# Patient Record
Sex: Female | Born: 1975 | Race: White | Hispanic: No | Marital: Married | Smoking: Never smoker
Health system: Southern US, Community
[De-identification: ages and names within clinical notes are randomized; demographics above are authoritative.]

## PROBLEM LIST (undated history)

## (undated) DIAGNOSIS — C801 Malignant (primary) neoplasm, unspecified: Secondary | ICD-10-CM

## (undated) DIAGNOSIS — E785 Hyperlipidemia, unspecified: Secondary | ICD-10-CM

## (undated) HISTORY — DX: Hyperlipidemia, unspecified: E78.5

---

## 2012-11-03 ENCOUNTER — Other Ambulatory Visit: Payer: Self-pay | Admitting: Obstetrics and Gynecology

## 2012-11-03 DIAGNOSIS — N6320 Unspecified lump in the left breast, unspecified quadrant: Secondary | ICD-10-CM

## 2012-11-14 ENCOUNTER — Ambulatory Visit
Admission: RE | Admit: 2012-11-14 | Discharge: 2012-11-14 | Disposition: A | Discharge status: Home / Self Care | Payer: Managed Care, Other (non HMO) | Source: Ambulatory Visit | Attending: Obstetrics and Gynecology | Admitting: Obstetrics and Gynecology

## 2012-11-14 DIAGNOSIS — N6320 Unspecified lump in the left breast, unspecified quadrant: Secondary | ICD-10-CM

## 2013-06-02 ENCOUNTER — Other Ambulatory Visit: Payer: Self-pay | Admitting: Obstetrics and Gynecology

## 2013-06-02 DIAGNOSIS — N6320 Unspecified lump in the left breast, unspecified quadrant: Secondary | ICD-10-CM

## 2013-06-08 ENCOUNTER — Other Ambulatory Visit: Payer: Managed Care, Other (non HMO)

## 2013-06-10 ENCOUNTER — Inpatient Hospital Stay: Admission: RE | Admit: 2013-06-10 | Payer: Managed Care, Other (non HMO) | Source: Ambulatory Visit

## 2013-08-04 ENCOUNTER — Ambulatory Visit
Admission: RE | Admit: 2013-08-04 | Discharge: 2013-08-04 | Disposition: A | Discharge status: Home / Self Care | Payer: Managed Care, Other (non HMO) | Source: Ambulatory Visit | Attending: Obstetrics and Gynecology | Admitting: Obstetrics and Gynecology

## 2013-08-04 DIAGNOSIS — N6320 Unspecified lump in the left breast, unspecified quadrant: Secondary | ICD-10-CM

## 2013-12-31 ENCOUNTER — Other Ambulatory Visit: Payer: Self-pay | Admitting: Physician Assistant

## 2013-12-31 ENCOUNTER — Other Ambulatory Visit: Payer: Self-pay | Admitting: Obstetrics and Gynecology

## 2013-12-31 DIAGNOSIS — N6320 Unspecified lump in the left breast, unspecified quadrant: Secondary | ICD-10-CM

## 2014-02-04 ENCOUNTER — Other Ambulatory Visit: Payer: Managed Care, Other (non HMO)

## 2014-02-15 ENCOUNTER — Ambulatory Visit
Admission: RE | Admit: 2014-02-15 | Discharge: 2014-02-15 | Disposition: A | Discharge status: Home / Self Care | Payer: Managed Care, Other (non HMO) | Source: Ambulatory Visit | Attending: Physician Assistant | Admitting: Physician Assistant

## 2014-02-15 DIAGNOSIS — N6320 Unspecified lump in the left breast, unspecified quadrant: Secondary | ICD-10-CM

## 2015-01-06 ENCOUNTER — Other Ambulatory Visit: Payer: Self-pay | Admitting: Physician Assistant

## 2015-01-06 DIAGNOSIS — N63 Unspecified lump in unspecified breast: Secondary | ICD-10-CM

## 2015-01-12 ENCOUNTER — Other Ambulatory Visit: Payer: Managed Care, Other (non HMO)

## 2015-03-31 ENCOUNTER — Ambulatory Visit
Admission: RE | Admit: 2015-03-31 | Discharge: 2015-03-31 | Disposition: A | Discharge status: Home / Self Care | Payer: Managed Care, Other (non HMO) | Source: Ambulatory Visit | Attending: Physician Assistant | Admitting: Physician Assistant

## 2015-03-31 DIAGNOSIS — N63 Unspecified lump in unspecified breast: Secondary | ICD-10-CM

## 2017-02-06 ENCOUNTER — Other Ambulatory Visit: Payer: Self-pay | Admitting: Physician Assistant

## 2017-02-06 DIAGNOSIS — Z1231 Encounter for screening mammogram for malignant neoplasm of breast: Secondary | ICD-10-CM

## 2017-02-21 ENCOUNTER — Ambulatory Visit
Admission: RE | Admit: 2017-02-21 | Discharge: 2017-02-21 | Disposition: A | Discharge status: Home / Self Care | Payer: BLUE CROSS/BLUE SHIELD | Source: Ambulatory Visit | Attending: Physician Assistant | Admitting: Physician Assistant

## 2017-02-21 DIAGNOSIS — Z1231 Encounter for screening mammogram for malignant neoplasm of breast: Secondary | ICD-10-CM

## 2018-02-12 ENCOUNTER — Other Ambulatory Visit: Payer: Self-pay | Admitting: Physician Assistant

## 2018-02-12 DIAGNOSIS — Z1231 Encounter for screening mammogram for malignant neoplasm of breast: Secondary | ICD-10-CM

## 2018-03-20 ENCOUNTER — Ambulatory Visit
Admission: RE | Admit: 2018-03-20 | Discharge: 2018-03-20 | Disposition: A | Discharge status: Home / Self Care | Payer: BLUE CROSS/BLUE SHIELD | Source: Ambulatory Visit | Attending: Physician Assistant | Admitting: Physician Assistant

## 2018-03-20 DIAGNOSIS — Z1231 Encounter for screening mammogram for malignant neoplasm of breast: Secondary | ICD-10-CM

## 2019-12-09 ENCOUNTER — Other Ambulatory Visit: Payer: Self-pay | Admitting: Physician Assistant

## 2019-12-09 DIAGNOSIS — Z1231 Encounter for screening mammogram for malignant neoplasm of breast: Secondary | ICD-10-CM

## 2019-12-16 ENCOUNTER — Other Ambulatory Visit: Payer: Self-pay

## 2019-12-16 ENCOUNTER — Ambulatory Visit
Admission: RE | Admit: 2019-12-16 | Discharge: 2019-12-16 | Disposition: A | Discharge status: Home / Self Care | Payer: BLUE CROSS/BLUE SHIELD | Source: Ambulatory Visit | Attending: Physician Assistant | Admitting: Physician Assistant

## 2019-12-16 DIAGNOSIS — Z1231 Encounter for screening mammogram for malignant neoplasm of breast: Secondary | ICD-10-CM

## 2021-03-02 ENCOUNTER — Other Ambulatory Visit: Payer: Self-pay | Admitting: Physician Assistant

## 2021-03-02 DIAGNOSIS — Z1231 Encounter for screening mammogram for malignant neoplasm of breast: Secondary | ICD-10-CM

## 2021-04-05 ENCOUNTER — Other Ambulatory Visit: Payer: Self-pay

## 2021-04-05 ENCOUNTER — Ambulatory Visit
Admission: RE | Admit: 2021-04-05 | Discharge: 2021-04-05 | Disposition: A | Discharge status: Home / Self Care | Payer: BC Managed Care – PPO | Source: Ambulatory Visit | Attending: Physician Assistant | Admitting: Physician Assistant

## 2021-04-05 DIAGNOSIS — Z1231 Encounter for screening mammogram for malignant neoplasm of breast: Secondary | ICD-10-CM

## 2022-02-27 ENCOUNTER — Other Ambulatory Visit: Payer: Self-pay | Admitting: Physician Assistant

## 2022-02-27 DIAGNOSIS — Z1231 Encounter for screening mammogram for malignant neoplasm of breast: Secondary | ICD-10-CM

## 2022-04-06 ENCOUNTER — Ambulatory Visit
Admission: RE | Admit: 2022-04-06 | Discharge: 2022-04-06 | Disposition: A | Discharge status: Home / Self Care | Payer: BC Managed Care – PPO | Source: Ambulatory Visit | Attending: Physician Assistant | Admitting: Physician Assistant

## 2022-04-06 DIAGNOSIS — Z1231 Encounter for screening mammogram for malignant neoplasm of breast: Secondary | ICD-10-CM

## 2023-03-04 IMAGING — MG MM DIGITAL SCREENING BILAT W/ TOMO AND CAD
8 series · 8 of 24 positions shown · non-contrast
Comparison: Previous exam(s).

CLINICAL DATA: Screening.

EXAM:
DIGITAL SCREENING BILATERAL MAMMOGRAM WITH TOMOSYNTHESIS AND CAD
TECHNIQUE: Bilateral screening digital craniocaudal and mediolateral oblique
mammograms were obtained. Bilateral screening digital breast
tomosynthesis was performed. The images were evaluated with
computer-aided detection.

[L CC synth-2D]
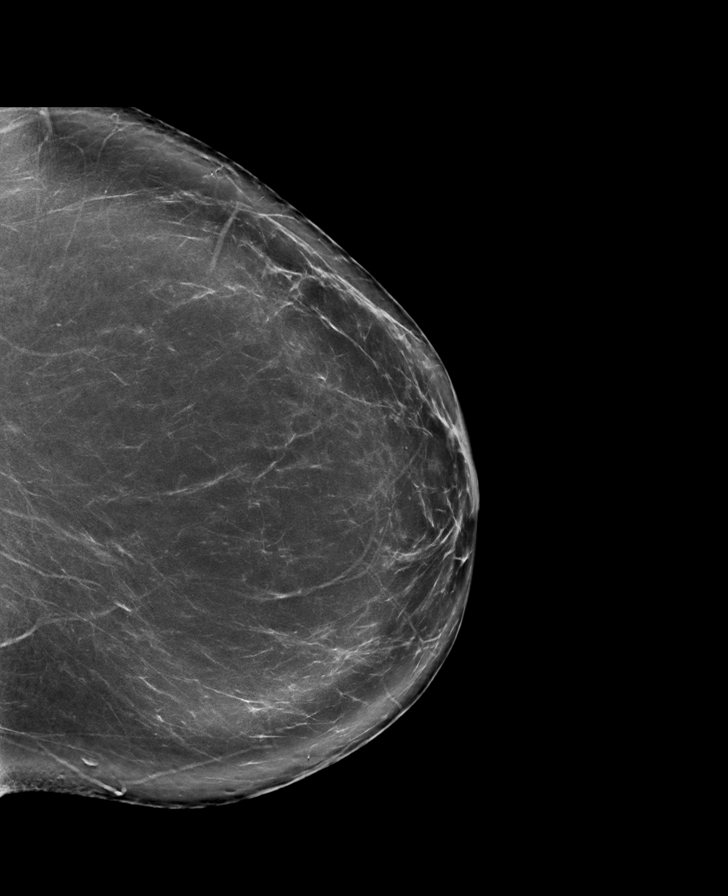

[R MLO synth-2D]
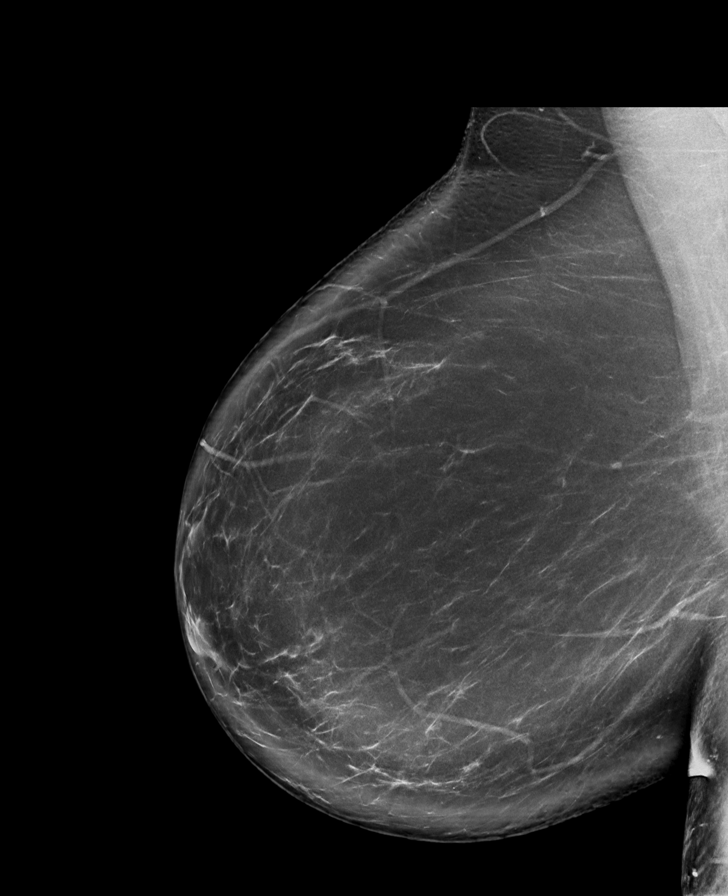

[L MLO synth-2D]
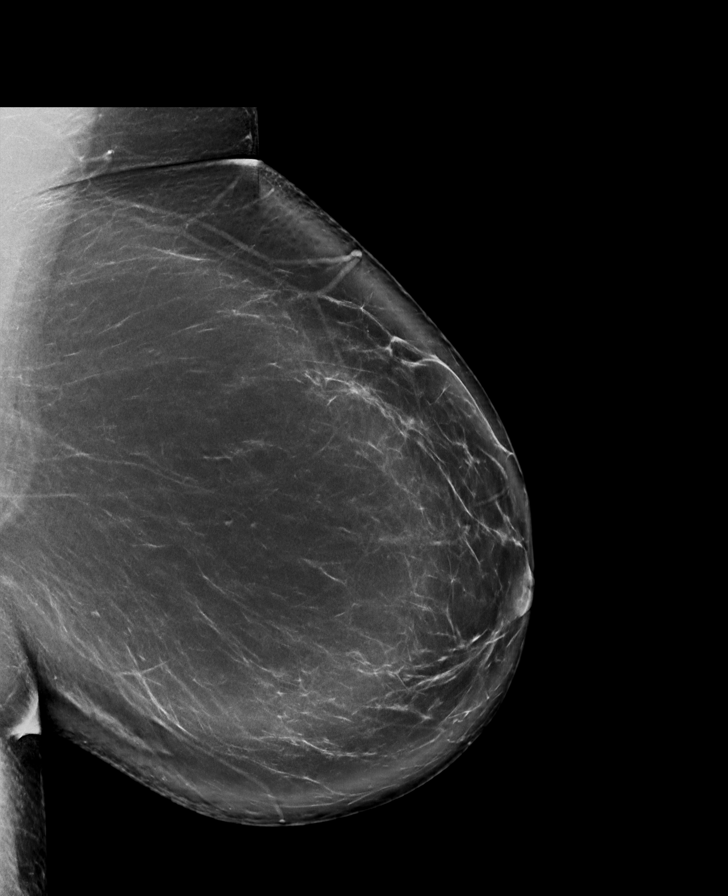

[R CC synth-2D]
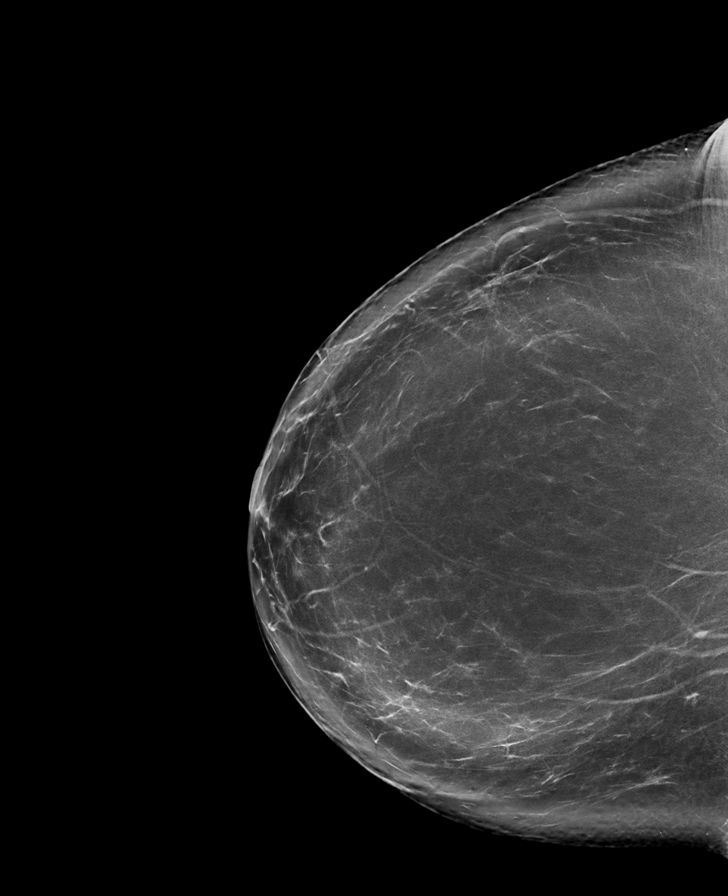

[R MLO tomo · tomo slice 55/110.0]
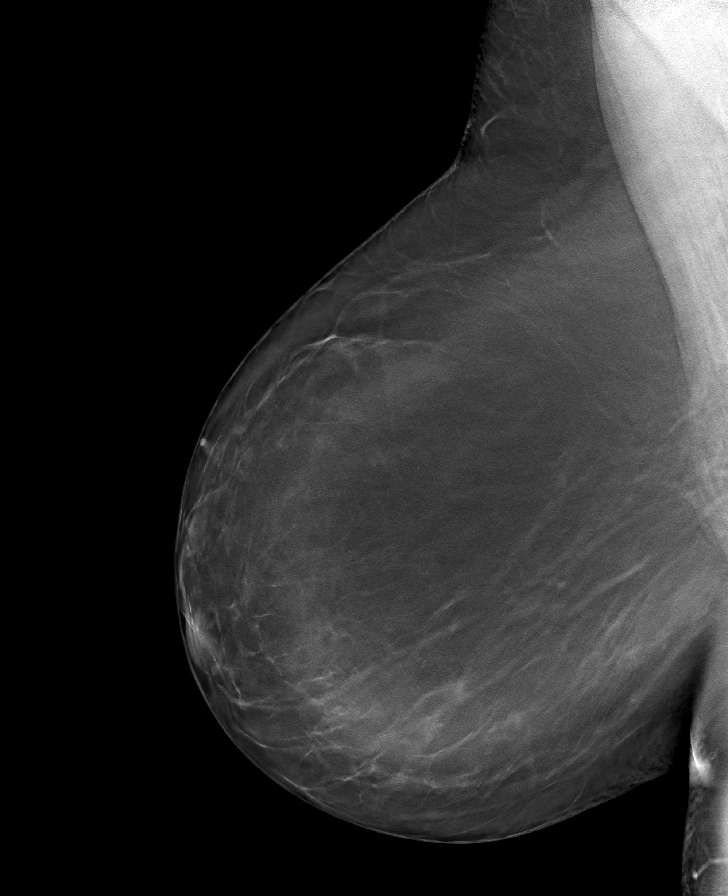

[L CC tomo · tomo slice 49/97.0]
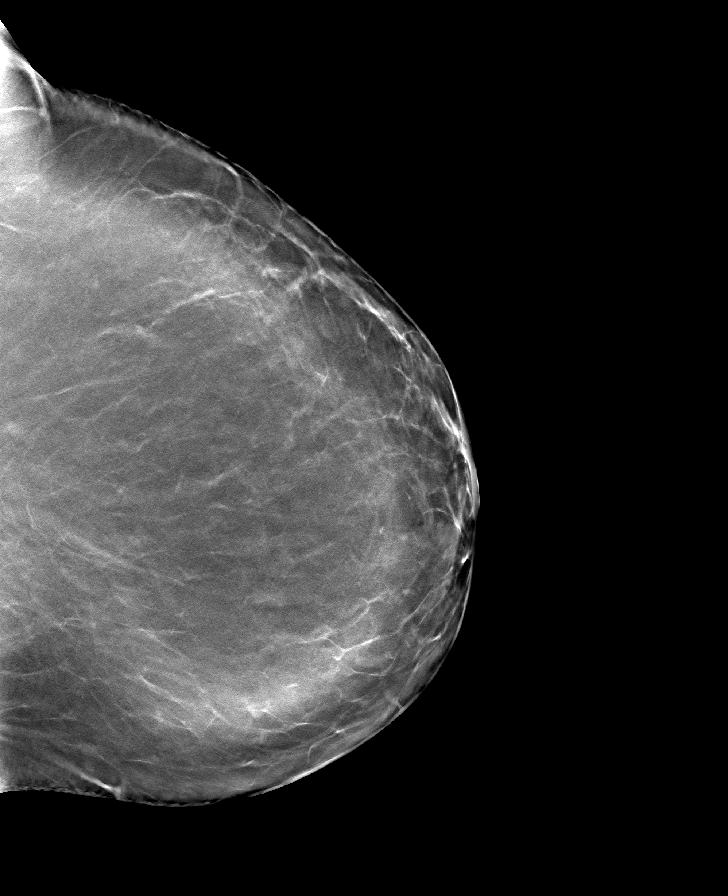

[L MLO tomo · tomo slice 55/110.0]
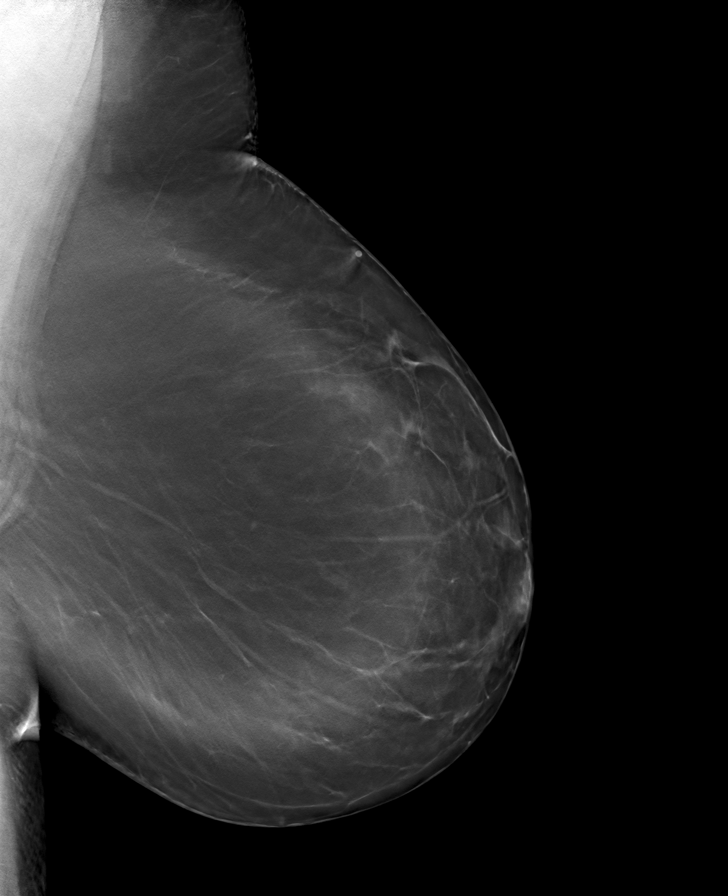

[R CC tomo · tomo slice 51/100.0]
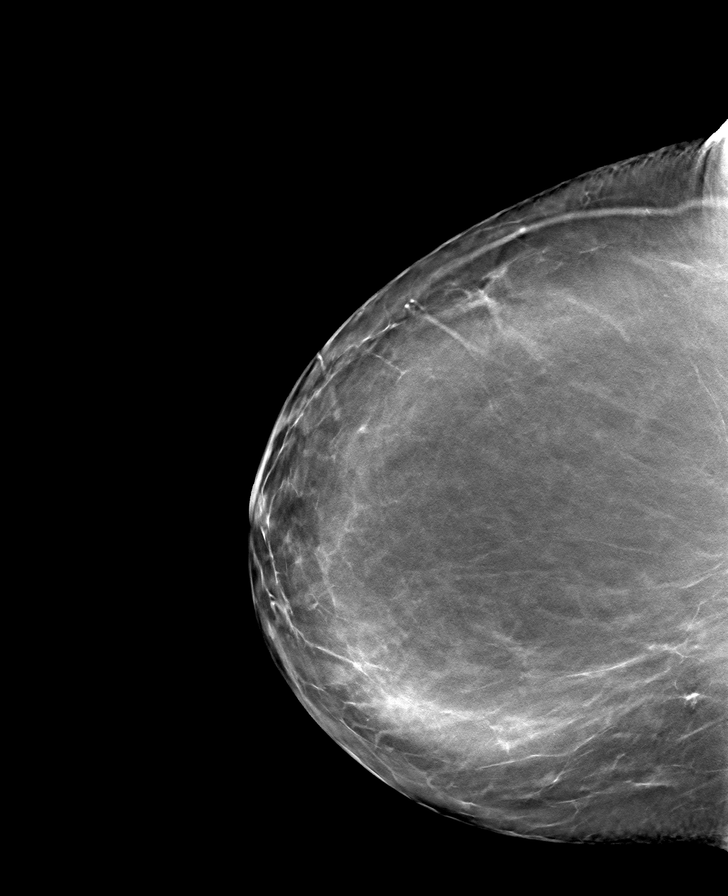

[8 of 24 positions shown; findings below may reference images not displayed]

ACR Breast Density Category b: There are scattered areas of
fibroglandular density.
FINDINGS: There are no findings suspicious for malignancy.
IMPRESSION: No mammographic evidence of malignancy. A result letter of this
screening mammogram will be mailed directly to the patient.

RECOMMENDATION:
Screening mammogram in one year. (Code:51-O-LD2)

BI-RADS CATEGORY  1: Negative.

## 2023-04-03 ENCOUNTER — Other Ambulatory Visit: Payer: Self-pay | Admitting: Physician Assistant

## 2023-04-03 DIAGNOSIS — Z1231 Encounter for screening mammogram for malignant neoplasm of breast: Secondary | ICD-10-CM

## 2023-04-24 ENCOUNTER — Ambulatory Visit
Admission: RE | Admit: 2023-04-24 | Discharge: 2023-04-24 | Disposition: A | Discharge status: Home / Self Care | Payer: BC Managed Care – PPO | Source: Ambulatory Visit | Attending: Physician Assistant | Admitting: Physician Assistant

## 2023-04-24 DIAGNOSIS — Z1231 Encounter for screening mammogram for malignant neoplasm of breast: Secondary | ICD-10-CM

## 2023-10-09 NOTE — Progress Notes (Signed)
 Patient presents with  . Annual Exam    CPE/Fasting    Martha Armstrong is here today for fasting CPE today  Low vit D in 2023- completed 12 weeks of 50000units Takes crestor with no complaints  Diet: tries to eat well balanced; she states she eats like once a day Exercise: not really exercising Water intake: maybe 30 oz Smoking: none Drinking: none No recreational drug use No concern for STDs.  Pap smear: UTD; due 2026; no FH of breast/ovarian cancer  Colonoscopy: not had one yet; due now; no FH of colon cancer Mammogram: UTD  Acute issues:  She does state that she had heavy menstrual bleeding for 18 days after not having a period for over a year. Bleeding occurred about 1 month ago.  Known h/o PCOS Carpel tunnel: on the computer all day   Patient Active Problem List   Diagnosis Date Noted  . Severe obesity (BMI 35.0-39.9) with comorbidity (*) 07/29/2018  . Secondary amenorrhea 11/09/2013  . PCOS (polycystic ovarian syndrome) 11/09/2013    [facility] Dr Sharman   . Vitamin D deficiency 02/26/2013  . Hyperlipidemia 02/26/2013   No Known Allergies  Outpatient Medications Marked as Taking for the 10/09/23 encounter (Annual Physical) with Lyle Jenkins Setters, NP  Medication Sig Dispense Refill  . rosuvastatin calcium (CRESTOR) 10 mg tablet Take one tablet (10 mg dose) by mouth at bedtime. 90 tablet 1    History reviewed. No pertinent past medical history. Past Surgical History:  Procedure Laterality Date  . Cesarean section     Social History   Socioeconomic History  . Marital status: Married  . Number of children: 2  Tobacco Use  . Smoking status: Never  . Smokeless tobacco: Never  Vaping Use  . Vaping status: Never Used  Substance and Sexual Activity  . Alcohol use: No  . Drug use: No  . Sexual activity: Yes    Partners: Male    Birth control/protection: Other-see comments    Comment: Vasectomy   Family History  Problem Relation Age of Onset  . Hypertension Mother    . Hypertension Father   . Arthritis Father        rheumatoid Arthritis  . Hyperlipidemia Father   . Diabetes Paternal Aunt   . Stroke Maternal Grandmother   . Stroke Maternal Grandfather   . Hypertension Maternal Grandfather   . Heart disease Maternal Grandfather   . Hypertension Paternal Grandmother   . Heart disease Paternal Grandmother   . Heart failure Paternal Grandmother        CHF  . Heart attack Paternal Grandfather   . Breast cancer Neg Hx   . Colon cancer Neg Hx   . Ovarian cancer Neg Hx    Immunization History  Administered Date(s) Administered  . Influenza Tri 02/26/2013   Lab Results  Component Value Date   WBC 5.8 04/11/2022   Hemoglobin 13.8 04/11/2022   Hematocrit 39.9 04/11/2022   MCV 85 04/11/2022   Platelet Count 228 04/11/2022   Lab Results  Component Value Date   Creatinine 0.96 04/11/2022   BUN 11 04/11/2022   Sodium 140 04/11/2022   Potassium 4.3 04/11/2022   Chloride 101 04/11/2022   CO2 25 04/11/2022   Lab Results  Component Value Date   ALT (SGPT) 37 (H) 04/11/2022   AST 29 04/11/2022   Alkaline Phosphatase 102 04/11/2022   Total Bilirubin 0.4 04/11/2022   Lab Results  Component Value Date   Hemoglobin A1c 5.2 04/11/2022   No  components found for: [facility] Lab Results  Component Value Date   TSH 1.490 04/11/2022      Review of Systems is complete and negative except as noted.  BP 138/76 (BP Location: Left Upper Arm, Patient Position: Sitting)   Pulse 88   Resp 16   Ht 5' 6.5 (1.689 m)   Wt 271 lb 12.8 oz (123.3 kg)   LMP 09/09/2023 (Approximate)   SpO2 96%   Breastfeeding No   BMI 43.21 kg/m   Weight stable. General: pleasant 48yo Caucasian female in NAD HEENT - TM's, nose and pharynx negative.  Dentition normal. Eyes -Fundoscopic exam without AV nicking, narrowing, exudates.   N/N - supple without adenopathy.  Thyroid normal without enlargement, nodularity or tendernss.  Negative bruit.   CV- regular rate,  rhythm without murmur.  LE/feet:  peripheral pulses equal bilaterally; no lower extremity edema; no  sensory deficit to light touch.   RESPIRATORY- breath sounds clear without wheezes, rales or rhonchi.   ABDOMEN - soft, nontender without masses, organomegaly, or tenderness.  Negative bruit.   BREASTS - supple and symmetrical without masses, tenderness, nipple discharge or skin changes.  No axillary lymphadenopathy. SBE technique reviewed.   Chaperone: corean silk, NP student  GU - deferred Rectal - deferred MS - spine midline without abnormal curvature. Full range-of-motion all joints without tenderness, bony abnormality, crepitus, erythema or effusion.   SKIN - warm and dry without lesions NEURO - PERRLA. EOMI. Alert and oriented, cognition intact. DTRs/strength/sensation symmetrical and normal. PSYCH - Mood/affect/behavior/thought content/judgement normal.     Assessment 1. Annual physical exam   2. Screening for deficiency anemia   3. Vitamin D deficiency   4. Mixed hyperlipidemia   5. Severe obesity (BMI 35.0-39.9) with comorbidity (*)   6. Colon cancer screening   7. Dysfunctional uterine bleeding      Plan 1-3, 6 Health maintenance/health promotion issues appropriate to age reviewed. Immunizations- declines Tdap today when offered Recommend healthy diet and regular exercise. Recommend multivitamin daily. Cardiovascular screening with blood pressure and blood work (CMP, Lipid panel). Diabetes screening with fasting glucose in those patients who are not diabetic. Depression Screening/Awareness.    Fasting blood work pending: CBC, CMP, Lipid panel and TSH Pap smears Q3 years if normal Recommend monthly self breast exams Mammograms annually starting at age 4 Patient is due for colon cancer screening Discussed options including colonoscopy and Cologuard She  is aware that colonoscopy is the gold standard She is aware that Cologuard could result as positive and she  would need a follow-up colonoscopy but that does not necessarily mean that there is colon cancer.  Discussed reasons for Cologuard to be positive including false positive, inflammation, precancerous polyps, colon cancer She would like to proceed with Cologuard.  Cologuard order sent today  4 HLD Continue medications as prescribed. No added salt/ heart healthy diet. Discussed ways to improve medication adherence. Encouraged/maintain heart healthy diet and active lifestyle with regular exercise program within physical limitations.  Goal is 30 5/7 days.'  5 Basic dietary information for weight loss and prevention/management of disease reviewed: -Eliminate sweetened beverages. -Reduce portion/frequency of refined sugars. -Reduce portion sizes overall of starchy and/or high fat foods. -Avoid fried food. -Try to switch to lower fat dairy products. -Consume 2 whole fruits daily. -Consume 4 servings non-starchy vegetables daily. -Choose the least processed food possible (whole fruits instead of fruit juices; whole grain cereals and breads). -No added salt diet. Referred to : https://www.reyes.com/   7 Hormones and  pelvic u/s pending  8 Overall, doing well.  Bracing at night.  Declines referral to orthopedic hand specialist today  I have personally obtained and documented a history of present illness and performed and documented a physical exam. Pt seen in conjunction with Stephanie Jasso, NP student I have reviewed the student's documentation of the Past Medical, Family, and Social History [facility]) and Review of Systems (ROS), exam and/or medical decision making, and based on my exam and medical decision making, confirm and agree. Diagnostic tests and/or x-rays performed or reviewed: None.      No follow-ups on file. No orders of the defined types were placed in this encounter.    Patient's Medications       * Accurate as of Oct 09, 2023 12:12 PM. Reflects encounter med changes as  of last refresh          Continued Medications      Instructions  ergocalciferol 50,000 units Caps capsule Commonly known as: Vitamin D2  50,000 Units, Oral, Weekly at 0900   phentermine 37.5 MG tablet Commonly known as: ADIPEX-P  37.5 mg, Oral, 30 minutes before breakfast   rosuvastatin calcium 10 mg tablet Commonly known as: CRESTOR  10 mg, Oral, At bedtime           *Some images could not be shown.

## 2023-12-27 ENCOUNTER — Telehealth: Payer: Self-pay

## 2023-12-27 ENCOUNTER — Encounter: Payer: Self-pay | Admitting: Psychiatry

## 2023-12-27 NOTE — Telephone Encounter (Signed)
 LVM for Ms.Gillott to call office regarding a referral to see Dr.Newton on 8/4.

## 2023-12-27 NOTE — Telephone Encounter (Signed)
 Spoke with Martha Armstrong regarding her referral to GYN oncology. She has an appointment scheduled with Dr. Eldonna on 12/30/23 at 9:45. Patient agrees to date and time. She has been provided with office address and location. She is also aware of our mask and visitor policy. Patient verbalized understanding and will call with any questions.

## 2023-12-30 ENCOUNTER — Inpatient Hospital Stay: Admitting: Gynecologic Oncology

## 2023-12-30 ENCOUNTER — Encounter: Payer: Self-pay | Admitting: Psychiatry

## 2023-12-30 ENCOUNTER — Inpatient Hospital Stay: Attending: Psychiatry | Admitting: Psychiatry

## 2023-12-30 VITALS — BP 134/73 | HR 88 | Temp 98.1°F | Resp 19 | Ht 64.0 in | Wt 270.4 lb

## 2023-12-30 DIAGNOSIS — Z6841 Body Mass Index (BMI) 40.0 and over, adult: Secondary | ICD-10-CM

## 2023-12-30 DIAGNOSIS — C541 Malignant neoplasm of endometrium: Secondary | ICD-10-CM

## 2023-12-30 DIAGNOSIS — E66813 Obesity, class 3: Secondary | ICD-10-CM | POA: Diagnosis not present

## 2023-12-30 NOTE — Patient Instructions (Addendum)
 Preparing for your Surgery  Plan for surgery on January 28, 2024 with Dr. Hoy Masters at [facility]. You will be scheduled for robotic assisted total laparoscopic hysterectomy (removal of the uterus and cervix), bilateral salpingo-oophorectomy (removal of both ovaries and fallopian tubes), sentinel lymph node biopsy, possible lymph node dissection, possible laparotomy (larger incision on your abdomen if needed).   Pre-operative Testing -You will receive a phone call from presurgical testing at [facility] to arrange for a pre-operative appointment and lab work.  -Bring your insurance card, copy of an advanced directive if applicable, medication list  -At that visit, you will be asked to sign a consent for a possible blood transfusion in case a transfusion becomes necessary during surgery.  The need for a blood transfusion is rare but having consent is a necessary part of your care.     -You should not be taking blood thinners or aspirin at least ten days prior to surgery unless instructed by your surgeon.  -Do not take supplements such as fish oil (omega 3), red yeast rice, turmeric before your surgery. STOP TAKING AT LEAST 10 DAYS BEFORE SURGERY. You want to avoid medications with aspirin in them including headache powders such as BC or Goody's), Excedrin migraine.  -If you are taking a GLP-1 medication/injection such as Ozempic, Mounjaro, [id number], this needs to be held before surgery for at least 7 days before.  Day Before Surgery at Home -You will be asked to take in a light diet the day before surgery. You will be advised you can have clear liquids up until 3 hours before your surgery.    Eat a light diet the day before surgery.  Examples including soups, broths, toast, yogurt, mashed potatoes.  AVOID GAS PRODUCING FOODS AND BEVERAGES. Things to avoid include carbonated beverages (fizzy beverages, sodas), raw fruits and raw vegetables (uncooked), or beans.   If your  bowels are filled with gas, your surgeon will have difficulty visualizing your pelvic organs which increases your surgical risks.  Your role in recovery Your role is to become active as soon as directed by your doctor, while still giving yourself time to heal.  Rest when you feel tired. You will be asked to do the following in order to speed your recovery:  - Cough and breathe deeply. This helps to clear and expand your lungs and can prevent pneumonia after surgery.  - STAY ACTIVE WHEN YOU GET HOME. Do mild physical activity. Walking or moving your legs help your circulation and body functions return to normal. Do not try to get up or walk alone the first time after surgery.   -If you develop swelling on one leg or the other, pain in the back of your leg, redness/warmth in one of your legs, please call the office or go to the Emergency Room to have a doppler to rule out a blood clot. For shortness of breath, chest pain-seek care in the Emergency Room as soon as possible. - Actively manage your pain. Managing your pain lets you move in comfort. We will ask you to rate your pain on a scale of zero to 10. It is your responsibility to tell your doctor or nurse where and how much you hurt so your pain can be treated.  Special Considerations -If you are diabetic, you may be placed on insulin after surgery to have closer control over your blood sugars to promote healing and recovery.  This does not mean that you will be discharged on  insulin.  If applicable, your oral antidiabetics will be resumed when you are tolerating a solid diet.  -Your final pathology results from surgery should be available around one week after surgery and the results will be relayed to you when available.  -FMLA forms can be faxed to [phone] and please allow 5-7 business days for completion.  Pain Management After Surgery -You will be prescribed your pain medication and bowel regimen medications before surgery closer to the  date so that you can have these available when you are discharged from the hospital. The pain medication is for use ONLY AFTER surgery and a new prescription will not be given.   -Make sure that you have Tylenol and Ibuprofen IF YOU ARE ABLE TO TAKE THESE MEDICATIONS at home to use on a regular basis after surgery for pain control. We recommend alternating the medications every hour to six hours since they work differently and are processed in the body differently for pain relief.  -Review the attached handout on narcotic use and their risks and side effects.   Bowel Regimen -You will be prescribed Sennakot-S to take nightly to prevent constipation especially if you are taking the narcotic pain medication intermittently.  It is important to prevent constipation and drink adequate amounts of liquids. You can stop taking this medication when you are not taking pain medication and you are back on your normal bowel routine.  Risks of Surgery Risks of surgery are low but include bleeding, infection, damage to surrounding structures, re-operation, blood clots, and very rarely death.   Blood Transfusion Information (For the consent to be signed before surgery)  We will be checking your blood type before surgery so in case of emergencies, we will know what type of blood you would need.                                            WHAT IS A BLOOD TRANSFUSION?  A transfusion is the replacement of blood or some of its parts. Blood is made up of multiple cells which provide different functions. Red blood cells carry oxygen and are used for blood loss replacement. White blood cells fight against infection. Platelets control bleeding. Plasma helps clot blood. Other blood products are available for specialized needs, such as hemophilia or other clotting disorders. BEFORE THE TRANSFUSION  Who gives blood for transfusions?  You may be able to donate blood to be used at a later date on yourself (autologous  donation). Relatives can be asked to donate blood. This is generally not any safer than if you have received blood from a stranger. The same precautions are taken to ensure safety when a relative's blood is donated. Healthy volunteers who are fully evaluated to make sure their blood is safe. This is blood bank blood. Transfusion therapy is the safest it has ever been in the practice of medicine. Before blood is taken from a donor, a complete history is taken to make sure that person has no history of diseases nor engages in risky social behavior (examples are intravenous drug use or sexual activity with multiple partners). The donor's travel history is screened to minimize risk of transmitting infections, such as malaria. The donated blood is tested for signs of infectious diseases, such as HIV and hepatitis. The blood is then tested to be sure it is compatible with you in order to minimize the chance  of a transfusion reaction. If you or a relative donates blood, this is often done in anticipation of surgery and is not appropriate for emergency situations. It takes many days to process the donated blood. RISKS AND COMPLICATIONS Although transfusion therapy is very safe and saves many lives, the main dangers of transfusion include:  Getting an infectious disease. Developing a transfusion reaction. This is an allergic reaction to something in the blood you were given. Every precaution is taken to prevent this. The decision to have a blood transfusion has been considered carefully by your caregiver before blood is given. Blood is not given unless the benefits outweigh the risks.  AFTER SURGERY INSTRUCTIONS  Return to work: 4-6 weeks if applicable  Activity: 1. Be up and out of the bed during the day.  Take a nap if needed.  You may walk up steps but be careful and use the hand rail.  Stair climbing will tire you more than you think, you may need to stop part way and rest.   2. No lifting or straining  for 6 weeks over 10 pounds. No pushing, pulling, straining for 6 weeks.  3. No driving for 4-89 days when the following criteria have been met: Do not drive if you are taking narcotic pain medicine and make sure that your reaction time has returned.   4. You can shower as soon as the next day after surgery. Shower daily.  Use your regular soap and water (not directly on the incision) and pat your incision(s) dry afterwards; don't rub.  No tub baths or submerging your body in water until cleared by your surgeon. If you have the soap that was given to you by pre-surgical testing that was used before surgery, you do not need to use it afterwards because this can irritate your incisions.   5. No sexual activity and nothing in the vagina for 12 weeks.  6. You may experience a small amount of clear drainage from your incisions, which is normal.  If the drainage persists, increases, or changes color please call the office.  7. Do not use creams, lotions, or ointments such as neosporin on your incisions after surgery until advised by your surgeon because they can cause removal of the dermabond glue on your incisions.    8. You may experience vaginal spotting after surgery or when the stitches at the top of the vagina begin to dissolve.  The spotting is normal but if you experience heavy bleeding, call our office.  9. Take Tylenol or ibuprofen first for pain if you are able to take these medications and only use narcotic pain medication for severe pain not relieved by the Tylenol or Ibuprofen.  Monitor your Tylenol intake to a max of 4,000 mg in a 24 hour period. You can alternate these medications after surgery.  Diet: 1. Low sodium Heart Healthy Diet is recommended but you are cleared to resume your normal (before surgery) diet after your procedure.  2. It is safe to use a laxative, such as Miralax or Colace, if you have difficulty moving your bowels before surgery. You have been prescribed Sennakot-S to  take at bedtime every evening after surgery to keep bowel movements regular and to prevent constipation.    Wound Care: 1. Keep clean and dry.  Shower daily.  Reasons to call the Doctor: Fever - Oral temperature greater than 100.4 degrees Fahrenheit Foul-smelling vaginal discharge Difficulty urinating Nausea and vomiting Increased pain at the site of the incision that is  unrelieved with pain medicine. Difficulty breathing with or without chest pain New calf pain especially if only on one side Sudden, continuing increased vaginal bleeding with or without clots.   Contacts: For questions or concerns you should contact:  Dr. Hoy Masters at [phone]  Eleanor Epps, NP at [phone]  After Hours: call [phone] and have the GYN Oncologist paged/contacted (after 5 pm or on the weekends). You will speak with an after hours RN and let he or she know you have had surgery.  Messages sent via mychart are for non-urgent matters and are not responded to after hours so for urgent needs, please call the after hours number.

## 2023-12-30 NOTE — Progress Notes (Signed)
 [facility] Video Visit   Patient ID:  Joanann Mies is a 48 y.o. (DOB 11-22-1975) female Place of service: patient home Patient has been advised as to the limitations and limited nature of physical exam due to nature of a video visit, the possibility of privacy risk in the use of a video visit, and that the healthcare provider may recommend visiting a healthcare clinic for in-person care and follow up.   Video Visit Assessment and Plan   1. Endometrial cancer (*) (Primary)    Reassurance. Emotional support provided Reviewed plan for surgery without need for chemo and/or radiation unless unexpected findings noted during her total hysterectomy.  Fortunately, they will be able to do this robotically they expect her to be able to return to work 2 weeks after surgery and full recovery within 8 weeks All questions and concerns addressed She will follow-up with me after surgery, sooner if needed  Current Medications[1]   Risk, benefits, and alternatives were provided through patient instructions given to the patient electronically and during the video interaction.  If any worsening symptoms or lack of improvement, the patient will seek immediate medical care.   Video Visit History      Patient presents with  . Endometrial Cancer     Jolette is here today for VV to discuss recent results at GYN She saw GYN about vaginal bleeding.  They gave her norethedrione to help stop th ebleeding They did an u/s after the bleeding stopped and didn't find anything but did ultimately decide to perform an endometrial biopsy.  She was subsequently diagnosed with grade 1 endometrial malignancy She met with GYN oncology surgeon today and they plan to do a total hysterectomy on 01/28/2024.    Reviewed and updated this visit by provider: Tobacco  Allergies  Meds  Problems  Med Hx  Surg Hx  Fam Hx        ROS:  As documented in the history above, all other relevant system complaints were  negative.    Video Visit Objective Findings   Examination conducted with the use of video cameras/computer monitors. Vital signs and other aspects of physical exam are limited due to the nature of this encounter.   Constitutional:  No apparent acute distress noted during the video interaction; Alert and oriented with normal mentation and verbally interactive. Mood:  Appears appropriate to situation.  Very pleasant 48 year old Caucasian female in no acute distress Anxious and tearful affect         [1]  Outpatient Encounter Medications as of 12/30/2023:  .  ergocalciferol (VITAMIN D2) 50,000 units CAPS capsule, Take one capsule (50,000 Units dose) by mouth once a week at 0900 for 12 doses. .  fluconazole (DIFLUCAN) 150 mg tablet, Take 1 tablet once for yeast infection; may repeat once in 3 days if needed .  rosuvastatin calcium (CRESTOR) 10 mg tablet, Take one tablet (10 mg dose) by mouth at bedtime.

## 2023-12-30 NOTE — Progress Notes (Signed)
 GYNECOLOGIC ONCOLOGY NEW PATIENT CONSULTATION  Date of Service: 12/30/2023 Referring Provider: Nena App, MD   ASSESSMENT AND PLAN: Martha Armstrong is a 48 y.o. woman with FIGO grade 1 endometrioid endometrial cancer.  We reviewed the nature of endometrial cancer and its recommended surgical staging, including total hysterectomy, bilateral salpingo-oophorectomy, and lymph node assessment. The patient is a suitable candidate for staging via a minimally invasive approach to surgery.  We reviewed that robotic assistance would be used to complete the surgery. We discussed that most endometrial cancer is detected early and that decisions regarding adjuvant therapy will be made based on her final pathology.   We reviewed the sentinel lymph node technique. Risks and benefits of sentinel lymph node biopsy was reviewed. We reviewed the technique and ICG dye. The patient DOES NOT have an iodine allergy or known liver dysfunction. We reviewed the false negative rate (0.4%), and that 3% of patients with metastatic disease will not have it detected by SLN biopsy in endometrial cancer. A low risk of allergic reaction to the dye, <0.2% for ICG, has been reported. We also discussed that in the case of failed mapping, which occurs 40% of the time, a bilateral or unilateral lymphadenectomy will be performed at the surgeon's discretion.   Potential benefits of sentinel nodes including a higher detection rate for metastasis due to ultrastaging and potential reduction in operative morbidity. However, there remains uncertainty as to the role for treatment of micrometastatic disease. Further, the benefit of operative morbidity associated with the SLN technique in endometrial cancer is not yet completely known. In other patient populations (e.g. the cervical cancer population) there has been observed reductions in morbidity with SLN biopsy compared to pelvic lymphadenectomy. Lymphedema, nerve dysfunction and  lymphocysts are all potential risks with the SLN technique as with complete lymphadenectomy. Additional risks to the patient include the risk of damage to an internal organ while operating in an altered view (e.g. the black and white image of the robotic fluorescence imaging mode).   Patient was consented for: Robotic assisted total laparoscopic hysterectomy, bilateral salpingo-oophorectomy, sentinel lymph node evaluation and biopsy, possible lymph node dissection on 01/28/24.  We reviewed that in the case of failed Trendelenburg, we could consider dilation and curettage with IUD insertion as a temporizing measure until optimization of weight, but I do not feel that this patient is at particular risk of needing this.  The risks of surgery were discussed in detail and she understands these to including but not limited to bleeding requiring a blood transfusion, infection, injury to adjacent organs (including but not limited to the bowels, bladder, ureters, nerves, blood vessels), thromboembolic events, wound separation, hernia, vaginal cuff separation, possible risk of lymphedema and lymphocyst if lymphadenectomy performed, unforseen complication, and possible need for re-exploration.  If the patient experiences any of these events, she understands that her hospitalization or recovery may be prolonged and that she may need to take additional medications for a prolonged period. The patient will receive DVT and antibiotic prophylaxis as indicated. She voiced a clear understanding. She had the opportunity to ask questions and informed consent was obtained today. She wishes to proceed.  She does not require preoperative clearance. Her METs are >4.  All preoperative instructions were reviewed. Postoperative expectations were also reviewed. Written handouts were provided to the patient.   A copy of this note was sent to the patient's referring provider.  Hoy Masters, MD Gynecologic Oncology   Medical  Decision Making I personally spent  TOTAL 60  minutes face-to-face and non-face-to-face in the care of this patient, which includes all pre, intra, and post visit time on the date of service.   ------------  CC: [facility]  HISTORY OF PRESENT ILLNESS:  Martha Armstrong is a 48 y.o. woman who is seen in consultation at the request of Nena App, MD for evaluation of endometrial cancer.  Patient was seen on 12/06/2023 for new GYN visit and noted abnormal uterine bleeding at that time.  She reported longstanding heavy uterine bleeding, PCOS, and anemia.  She reports that she has been bleeding for 15 days with large clots.  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  She was recommended to undergo endometrial biopsy which was performed on 12/17/2023 resulted with FIGO grade 1 endometrioid endometrial cancer  Today patient reports that she is still on her progesterone taper.  She has stopped bleeding.  She reports that she was instructed to take the norethindrone for 21 days then stop for withdrawal bleed and resume on day 16.  She otherwise denies abdominal bloating, early satiety, significant weight loss, change in bowel or bladder habits.    PAST MEDICAL HISTORY: Past Medical History:  Diagnosis Date   Hyperlipidemia     PAST SURGICAL HISTORY: Past Surgical History:  Procedure Laterality Date   CESAREAN SECTION  10/04/2003   CESAREAN SECTION  10/16/2005    OB/GYN HISTORY: OB History  Gravida Para Term Preterm AB Living  2 2 2   2   SAB IAB Ectopic Multiple Live Births      2    # Outcome Date GA Lbr Len/2nd Weight Sex Type Anes PTL Lv  2 Term      CS-Unspec   LIV  1 Term      CS-Unspec   LIV      Age at menarche: 68 Age at menopause: n/a Hx of HRT: norethindrone Hx of STI: no Last pap: 2021 normal per pt report History of abnormal pap smears: none  SCREENING STUDIES:  Last mammogram: 03/2023 Last  colonoscopy: no  MEDICATIONS:  Current Outpatient Medications:    norethindrone (AYGESTIN) 5 MG tablet, Take 5 mg by mouth daily., Disp: , Rfl:    rosuvastatin (CRESTOR) 10 MG tablet, Take 10 mg by mouth., Disp: , Rfl:   ALLERGIES: No Known Allergies  FAMILY HISTORY: Family History  Problem Relation Age of Onset   Breast cancer Neg Hx    Ovarian cancer Neg Hx    Colon cancer Neg Hx    Endometrial cancer Neg Hx     SOCIAL HISTORY: Social History   Socioeconomic History   Marital status: Married    Spouse name: Not on file   Number of children: Not on file   Years of education: Not on file   Highest education level: Not on file  Occupational History   Not on file  Tobacco Use   Smoking status: Never   Smokeless tobacco: Never  Substance and Sexual Activity   Alcohol use: Not Currently   Drug use: Never   Sexual activity: Yes    Partners: Male    Birth control/protection: Surgical  Other Topics Concern   Not on file  Social History Narrative   Not on file   Social Drivers of Health   Financial Resource Strain: Low Risk  (10/06/2023)   Received from [facility]   Overall Financial Resource Strain (CARDIA)    Difficulty of Paying Living Expenses: Not hard at all  Food Insecurity: No Food Insecurity (10/06/2023)   Received from [facility]   Hunger Vital Sign    Within the past 12 months, you worried that your food would run out before you got the money to buy more.: Never true    Within the past 12 months, the food you bought just didn't last and you didn't have money to get more.: Never true  Transportation Needs: No Transportation Needs (10/06/2023)   Received from [facility]   PRAPARE - Transportation    Lack of Transportation (Medical): No    Lack of Transportation (Non-Medical): No  Physical Activity: Unknown (10/06/2023)   Received from [facility]   Exercise Vital Sign    On average, how many days per week do you engage in moderate to strenuous  exercise (like a brisk walk)?: 0 days    Minutes of Exercise per Session: Not on file  Stress: No Stress Concern Present (10/06/2023)   Received from [facility] - Occupational Stress Questionnaire    Feeling of Stress : Not at all  Social Connections: Socially Integrated (10/06/2023)   Received from [facility]   Social Network    How would you rate your social network (family, work, friends)?: Good participation with social networks  Intimate Partner Violence: Not At Risk (10/06/2023)   Received from [facility]   HITS    Over the last 12 months how often did your partner physically hurt you?: Never    Over the last 12 months how often did your partner insult you or talk down to you?: Never    Over the last 12 months how often did your partner threaten you with physical harm?: Never    Over the last 12 months how often did your partner scream or curse at you?: Never    REVIEW OF SYSTEMS: New patient intake form was reviewed.  Complete 10-system review is negative except for the following: Menstrual problems, vaginal bleeding  PHYSICAL EXAM: BP 134/73 (BP Location: Left Arm, Patient Position: Sitting)   Pulse 88   Temp 98.1 F (36.7 C) (Oral)   Resp 19   Ht 5' 4 (1.626 m)   Wt 270 lb 6.4 oz (122.7 kg)   SpO2 97%   BMI 46.41 kg/m  Constitutional: No acute distress. Neuro/Psych: Alert, oriented.  Head and Neck: Normocephalic, atraumatic. Neck symmetric without masses. Sclera anicteric.  Respiratory: Normal work of breathing. Clear to auscultation bilaterally. Cardiovascular: Regular rate and rhythm, no murmurs, rubs, or gallops. Abdomen: Normoactive bowel sounds. Soft, non-distended, non-tender to palpation. No masses appreciated. Well healed pfannenstiel incision Extremities: Grossly normal range of motion. Warm, well perfused. No edema bilaterally. Skin: No rashes or lesions. Lymphatic: No cervical, supraclavicular, or inguinal  adenopathy. Genitourinary: External genitalia without lesions. Urethral meatus without lesions or prolapse. On speculum exam, vagina and cervix without lesions. Bimanual exam reveals normal cervix, midplane mobile uterus, no adnexal mass or nodularity. . Exam chaperoned by Eleanor Epps, NP   LABORATORY AND RADIOLOGIC DATA: Outside medical records were reviewed to synthesize the above history, along with the history and physical obtained during the visit.  Outside laboratory, pathology, and imaging reports were reviewed, with pertinent results below.    No results found for: WBC, HGB, HCT, PLT, LDH, MG, CREATININE, AST, ALT, CAN125, CA125, CEA, AFPTM, CA199, HCGTM, DIAGPAP, HPV  Surgical pathology (12/17/23): Endometrium, biopsy: Endometrial adenocarcinoma, endometrioid type, FIGO grade 1 of 3 ER: Positive (greater than 90%) PR: Positive (greater  than 90%) P53 expression: Normal expression (wild-type) MMR proteins: Intact

## 2023-12-30 NOTE — H&P (View-Only) (Signed)
 GYNECOLOGIC ONCOLOGY NEW PATIENT CONSULTATION  Date of Service: 12/30/2023 Referring Provider: Nena App, MD   ASSESSMENT AND PLAN: Martha Armstrong is a 48 y.o. woman with FIGO grade 1 endometrioid endometrial cancer.  We reviewed the nature of endometrial cancer and its recommended surgical staging, including total hysterectomy, bilateral salpingo-oophorectomy, and lymph node assessment. The patient is a suitable candidate for staging via a minimally invasive approach to surgery.  We reviewed that robotic assistance would be used to complete the surgery. We discussed that most endometrial cancer is detected early and that decisions regarding adjuvant therapy will be made based on her final pathology.   We reviewed the sentinel lymph node technique. Risks and benefits of sentinel lymph node biopsy was reviewed. We reviewed the technique and ICG dye. The patient DOES NOT have an iodine allergy or known liver dysfunction. We reviewed the false negative rate (0.4%), and that 3% of patients with metastatic disease will not have it detected by SLN biopsy in endometrial cancer. A low risk of allergic reaction to the dye, <0.2% for ICG, has been reported. We also discussed that in the case of failed mapping, which occurs 40% of the time, a bilateral or unilateral lymphadenectomy will be performed at the surgeon's discretion.   Potential benefits of sentinel nodes including a higher detection rate for metastasis due to ultrastaging and potential reduction in operative morbidity. However, there remains uncertainty as to the role for treatment of micrometastatic disease. Further, the benefit of operative morbidity associated with the SLN technique in endometrial cancer is not yet completely known. In other patient populations (e.g. the cervical cancer population) there has been observed reductions in morbidity with SLN biopsy compared to pelvic lymphadenectomy. Lymphedema, nerve dysfunction and  lymphocysts are all potential risks with the SLN technique as with complete lymphadenectomy. Additional risks to the patient include the risk of damage to an internal organ while operating in an altered view (e.g. the black and white image of the robotic fluorescence imaging mode).   Patient was consented for: Robotic assisted total laparoscopic hysterectomy, bilateral salpingo-oophorectomy, sentinel lymph node evaluation and biopsy, possible lymph node dissection on 01/28/24.  We reviewed that in the case of failed Trendelenburg, we could consider dilation and curettage with IUD insertion as a temporizing measure until optimization of weight, but I do not feel that this patient is at particular risk of needing this.  The risks of surgery were discussed in detail and she understands these to including but not limited to bleeding requiring a blood transfusion, infection, injury to adjacent organs (including but not limited to the bowels, bladder, ureters, nerves, blood vessels), thromboembolic events, wound separation, hernia, vaginal cuff separation, possible risk of lymphedema and lymphocyst if lymphadenectomy performed, unforseen complication, and possible need for re-exploration.  If the patient experiences any of these events, she understands that her hospitalization or recovery may be prolonged and that she may need to take additional medications for a prolonged period. The patient will receive DVT and antibiotic prophylaxis as indicated. She voiced a clear understanding. She had the opportunity to ask questions and informed consent was obtained today. She wishes to proceed.  She does not require preoperative clearance. Her METs are >4.  All preoperative instructions were reviewed. Postoperative expectations were also reviewed. Written handouts were provided to the patient.   A copy of this note was sent to the patient's referring provider.  Hoy Masters, MD Gynecologic Oncology   Medical  Decision Making I personally spent  TOTAL 60  minutes face-to-face and non-face-to-face in the care of this patient, which includes all pre, intra, and post visit time on the date of service.   ------------  CC: [facility]  HISTORY OF PRESENT ILLNESS:  Martha Armstrong is a 48 y.o. woman who is seen in consultation at the request of Nena App, MD for evaluation of endometrial cancer.  Patient was seen on 12/06/2023 for new GYN visit and noted abnormal uterine bleeding at that time.  She reported longstanding heavy uterine bleeding, PCOS, and anemia.  She reports that she has been bleeding for 15 days with large clots.  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  She was recommended to undergo endometrial biopsy which was performed on 12/17/2023 resulted with FIGO grade 1 endometrioid endometrial cancer  Today patient reports that she is still on her progesterone taper.  She has stopped bleeding.  She reports that she was instructed to take the norethindrone for 21 days then stop for withdrawal bleed and resume on day 16.  She otherwise denies abdominal bloating, early satiety, significant weight loss, change in bowel or bladder habits.    PAST MEDICAL HISTORY: Past Medical History:  Diagnosis Date   Hyperlipidemia     PAST SURGICAL HISTORY: Past Surgical History:  Procedure Laterality Date   CESAREAN SECTION  10/04/2003   CESAREAN SECTION  10/16/2005    OB/GYN HISTORY: OB History  Gravida Para Term Preterm AB Living  2 2 2   2   SAB IAB Ectopic Multiple Live Births      2    # Outcome Date GA Lbr Len/2nd Weight Sex Type Anes PTL Lv  2 Term      CS-Unspec   LIV  1 Term      CS-Unspec   LIV      Age at menarche: 68 Age at menopause: n/a Hx of HRT: norethindrone Hx of STI: no Last pap: 2021 normal per pt report History of abnormal pap smears: none  SCREENING STUDIES:  Last mammogram: 03/2023 Last  colonoscopy: no  MEDICATIONS:  Current Outpatient Medications:    norethindrone (AYGESTIN) 5 MG tablet, Take 5 mg by mouth daily., Disp: , Rfl:    rosuvastatin (CRESTOR) 10 MG tablet, Take 10 mg by mouth., Disp: , Rfl:   ALLERGIES: No Known Allergies  FAMILY HISTORY: Family History  Problem Relation Age of Onset   Breast cancer Neg Hx    Ovarian cancer Neg Hx    Colon cancer Neg Hx    Endometrial cancer Neg Hx     SOCIAL HISTORY: Social History   Socioeconomic History   Marital status: Married    Spouse name: Not on file   Number of children: Not on file   Years of education: Not on file   Highest education level: Not on file  Occupational History   Not on file  Tobacco Use   Smoking status: Never   Smokeless tobacco: Never  Substance and Sexual Activity   Alcohol use: Not Currently   Drug use: Never   Sexual activity: Yes    Partners: Male    Birth control/protection: Surgical  Other Topics Concern   Not on file  Social History Narrative   Not on file   Social Drivers of Health   Financial Resource Strain: Low Risk  (10/06/2023)   Received from [facility]   Overall Financial Resource Strain (CARDIA)    Difficulty of Paying Living Expenses: Not hard at all  Food Insecurity: No Food Insecurity (10/06/2023)   Received from [facility]   Hunger Vital Sign    Within the past 12 months, you worried that your food would run out before you got the money to buy more.: Never true    Within the past 12 months, the food you bought just didn't last and you didn't have money to get more.: Never true  Transportation Needs: No Transportation Needs (10/06/2023)   Received from [facility]   PRAPARE - Transportation    Lack of Transportation (Medical): No    Lack of Transportation (Non-Medical): No  Physical Activity: Unknown (10/06/2023)   Received from [facility]   Exercise Vital Sign    On average, how many days per week do you engage in moderate to strenuous  exercise (like a brisk walk)?: 0 days    Minutes of Exercise per Session: Not on file  Stress: No Stress Concern Present (10/06/2023)   Received from [facility] - Occupational Stress Questionnaire    Feeling of Stress : Not at all  Social Connections: Socially Integrated (10/06/2023)   Received from [facility]   Social Network    How would you rate your social network (family, work, friends)?: Good participation with social networks  Intimate Partner Violence: Not At Risk (10/06/2023)   Received from [facility]   HITS    Over the last 12 months how often did your partner physically hurt you?: Never    Over the last 12 months how often did your partner insult you or talk down to you?: Never    Over the last 12 months how often did your partner threaten you with physical harm?: Never    Over the last 12 months how often did your partner scream or curse at you?: Never    REVIEW OF SYSTEMS: New patient intake form was reviewed.  Complete 10-system review is negative except for the following: Menstrual problems, vaginal bleeding  PHYSICAL EXAM: BP 134/73 (BP Location: Left Arm, Patient Position: Sitting)   Pulse 88   Temp 98.1 F (36.7 C) (Oral)   Resp 19   Ht 5' 4 (1.626 m)   Wt 270 lb 6.4 oz (122.7 kg)   SpO2 97%   BMI 46.41 kg/m  Constitutional: No acute distress. Neuro/Psych: Alert, oriented.  Head and Neck: Normocephalic, atraumatic. Neck symmetric without masses. Sclera anicteric.  Respiratory: Normal work of breathing. Clear to auscultation bilaterally. Cardiovascular: Regular rate and rhythm, no murmurs, rubs, or gallops. Abdomen: Normoactive bowel sounds. Soft, non-distended, non-tender to palpation. No masses appreciated. Well healed pfannenstiel incision Extremities: Grossly normal range of motion. Warm, well perfused. No edema bilaterally. Skin: No rashes or lesions. Lymphatic: No cervical, supraclavicular, or inguinal  adenopathy. Genitourinary: External genitalia without lesions. Urethral meatus without lesions or prolapse. On speculum exam, vagina and cervix without lesions. Bimanual exam reveals normal cervix, midplane mobile uterus, no adnexal mass or nodularity. . Exam chaperoned by Eleanor Epps, NP   LABORATORY AND RADIOLOGIC DATA: Outside medical records were reviewed to synthesize the above history, along with the history and physical obtained during the visit.  Outside laboratory, pathology, and imaging reports were reviewed, with pertinent results below.    No results found for: WBC, HGB, HCT, PLT, LDH, MG, CREATININE, AST, ALT, CAN125, CA125, CEA, AFPTM, CA199, HCGTM, DIAGPAP, HPV  Surgical pathology (12/17/23): Endometrium, biopsy: Endometrial adenocarcinoma, endometrioid type, FIGO grade 1 of 3 ER: Positive (greater than 90%) PR: Positive (greater  than 90%) P53 expression: Normal expression (wild-type) MMR proteins: Intact

## 2024-01-01 ENCOUNTER — Other Ambulatory Visit: Payer: Self-pay | Admitting: Gynecologic Oncology

## 2024-01-01 DIAGNOSIS — C541 Malignant neoplasm of endometrium: Secondary | ICD-10-CM

## 2024-01-07 ENCOUNTER — Telehealth: Payer: Self-pay | Admitting: Licensed Clinical Social Worker

## 2024-01-07 ENCOUNTER — Encounter: Payer: Self-pay | Admitting: Obstetrics and Gynecology

## 2024-01-07 NOTE — Telephone Encounter (Signed)
 [facility] Clinical Social Work  Clinical Social Work was referred by new patient protocol for assessment of psychosocial needs.  Clinical Social Worker attempted to contact patient by phone to offer support and assess for needs.   No answer. Left VM with direct contact information and brief description of support services.     Jerimy Johanson E Carlis Burnsworth, LCSW  Clinical Social Worker [facility]

## 2024-01-16 NOTE — Patient Instructions (Signed)
 SURGICAL WAITING ROOM VISITATION Patients having surgery or a procedure may have no more than 2 support people in the waiting area - these visitors may rotate in the visitor waiting room.   Due to an increase in RSV and influenza rates and associated hospitalizations, children ages 49 and under may not visit patients in [facility] hospitals. If the patient needs to stay at the hospital during part of their recovery, the visitor guidelines for inpatient rooms apply.  PRE-OP VISITATION  Pre-op nurse will coordinate an appropriate time for 1 support person to accompany the patient in pre-op.  This support person may not rotate.  This visitor will be contacted when the time is appropriate for the visitor to come back in the pre-op area.  Please refer to the [facility] website for the visitor guidelines for Inpatients (after your surgery is over and you are in a regular room).  You are not required to quarantine at this time prior to your surgery. However, you must do this: Hand Hygiene often Do NOT share personal items Notify your provider if you are in close contact with someone who has COVID or you develop fever 100.4 or greater, new onset of sneezing, cough, sore throat, shortness of breath or body aches.  If you test positive for Covid or have been in contact with anyone that has tested positive in the last 10 days please notify you surgeon.    Your procedure is scheduled on:  01/28/24  Report to [facility] Main Entrance: [place] entrance where the [employer] is available.   Report to admitting at: 7:45 AM  Call this number if you have any questions or problems the morning of surgery [phone]  FOLLOW ANY ADDITIONAL PRE OP INSTRUCTIONS YOU RECEIVED FROM YOUR SURGEON'S OFFICE!!!  Eat a light diet the day before surgery.  Examples including soups, broths, toast, yogurt, mashed potatoes.  Things to avoid include carbonated beverages (fizzy beverages), raw fruits and raw  vegetables, or beans.   If your bowels are filled with gas, your surgeon will have difficulty visualizing your pelvic organs which increases your surgical risks.  Do not eat food after Midnight the night prior to your surgery/procedure.  After Midnight you may have the following liquids until : 7:00 AM DAY OF SURGERY  Clear Liquid Diet Water Black Coffee (sugar ok, NO MILK/CREAM OR CREAMERS)  Tea (sugar ok, NO MILK/CREAM OR CREAMERS) regular and decaf                             Plain Jell-O  with no fruit (NO RED)                                           Fruit ices (not with fruit pulp, NO RED)                                     Popsicles (NO RED)  Juice: NO CITRUS JUICES: only apple, WHITE grape, WHITE cranberry Sports drinks like Gatorade or Powerade (NO RED)   Oral Hygiene is also important to reduce your risk of infection.        Remember - BRUSH YOUR TEETH THE MORNING OF SURGERY WITH YOUR REGULAR TOOTHPASTE  Do NOT smoke after Midnight the night before surgery.  STOP TAKING all Vitamins, Herbs and supplements 1 week before your surgery.   Take ONLY these medicines the morning of surgery with A SIP OF WATER: NONE.  If You have been diagnosed with Sleep Apnea - Bring CPAP mask and tubing day of surgery. We will provide you with a CPAP machine on the day of your surgery.                   You may not have any metal on your body including hair pins, jewelry, and body piercing  Do not wear make-up, lotions, powders, perfumes / cologne, or deodorant  Do not wear nail polish including gel and S&S, artificial / acrylic nails, or any other type of covering on natural nails including finger and toenails. If you have artificial nails, gel coating, etc., that needs to be removed by a nail salon, Please have this removed prior to surgery. Not doing so may mean that your surgery could be cancelled or delayed if the Surgeon or  anesthesia staff feels like they are unable to monitor you safely.   Do not shave 48 hours prior to surgery to avoid nicks in your skin which may contribute to postoperative infections.   Contacts, Hearing Aids, dentures or bridgework may not be worn into surgery. DENTURES WILL BE REMOVED PRIOR TO SURGERY PLEASE DO NOT APPLY Poly grip OR ADHESIVES!!!  You may bring a small overnight bag with you on the day of surgery, only pack items that are not valuable. [facility] IS NOT RESPONSIBLE   FOR VALUABLES THAT ARE LOST OR STOLEN.   Patients discharged on the day of surgery will not be allowed to drive home.  Someone NEEDS to stay with you for the first 24 hours after anesthesia.  Do not bring your home medications to the hospital. The Pharmacy will dispense medications listed on your medication list to you during your admission in the Hospital.  Special Instructions: Bring a copy of your healthcare power of attorney and living will documents the day of surgery, if you wish to have them scanned into your [facility]- EPIC  Please read over the following fact sheets you were given: IF YOU HAVE QUESTIONS ABOUT YOUR PRE-OP INSTRUCTIONS, PLEASE CALL [phone]   [facility] - Preparing for Surgery Before surgery, you can play an important role.  Because skin is not sterile, your skin needs to be as free of germs as possible.  You can reduce the number of germs on your skin by washing with CHG (chlorahexidine gluconate) soap before surgery.  CHG is an antiseptic cleaner which kills germs and bonds with the skin to continue killing germs even after washing. Please DO NOT use if you have an allergy to CHG or antibacterial soaps.  If your skin becomes reddened/irritated stop using the CHG and inform your nurse when you arrive at Short Stay. Do not shave (including legs and underarms) for at least 48 hours prior to the first CHG shower.  You may shave your face/neck.  Please follow these  instructions carefully:  1.  Shower with CHG Soap the night before surgery and the  morning of surgery.  2.  If you choose to wash your hair, wash your hair first as usual with your normal  shampoo.  3.  After you shampoo, rinse your hair and body thoroughly to remove the shampoo.                             4.  Use CHG as you would any other liquid soap.  You can apply chg directly to the skin and wash.  Gently with a scrungie or clean washcloth.  5.  Apply the CHG Soap to your body ONLY FROM THE NECK DOWN.   Do not use on face/ open                           Wound or open sores. Avoid contact with eyes, ears mouth and genitals (private parts).                       Wash face,  Genitals (private parts) with your normal soap.             6.  Wash thoroughly, paying special attention to the area where your  surgery  will be performed.  7.  Thoroughly rinse your body with warm water from the neck down.  8.  DO NOT shower/wash with your normal soap after using and rinsing off the CHG Soap.            9.  Pat yourself dry with a clean towel.            10.  Wear clean pajamas.            11.  Place clean sheets on your bed the night of your first shower and do not  sleep with pets.  ON THE DAY OF SURGERY : Do not apply any lotions/deodorants the morning of surgery.  Please wear clean clothes to the hospital/surgery center.     FAILURE TO FOLLOW THESE INSTRUCTIONS MAY RESULT IN THE CANCELLATION OF YOUR SURGERY  PATIENT SIGNATURE_________________________________  NURSE SIGNATURE__________________________________  ________________________________________________________________________   WHAT IS A BLOOD TRANSFUSION? Blood Transfusion Information  A transfusion is the replacement of blood or some of its parts. Blood is made up of multiple cells which provide different functions. Red blood cells carry oxygen and are used for blood loss replacement. White blood cells fight against  infection. Platelets control bleeding. Plasma helps clot blood. Other blood products are available for specialized needs, such as hemophilia or other clotting disorders. BEFORE THE TRANSFUSION  Who gives blood for transfusions?  Healthy volunteers who are fully evaluated to make sure their blood is safe. This is blood bank blood. Transfusion therapy is the safest it has ever been in the practice of medicine. Before blood is taken from a donor, a complete history is taken to make sure that person has no history of diseases nor engages in risky social behavior (examples are intravenous drug use or sexual activity with multiple partners). The donor's travel history is screened to minimize risk of transmitting infections, such as malaria. The donated blood is tested for signs of infectious diseases, such as HIV and hepatitis. The blood is then tested to be sure it is compatible with you in order to minimize the chance of a transfusion reaction. If you or a relative donates blood, this is often done in anticipation of surgery and is not  appropriate for emergency situations. It takes many days to process the donated blood. RISKS AND COMPLICATIONS Although transfusion therapy is very safe and saves many lives, the main dangers of transfusion include:  Getting an infectious disease. Developing a transfusion reaction. This is an allergic reaction to something in the blood you were given. Every precaution is taken to prevent this. The decision to have a blood transfusion has been considered carefully by your caregiver before blood is given. Blood is not given unless the benefits outweigh the risks. AFTER THE TRANSFUSION Right after receiving a blood transfusion, you will usually feel much better and more energetic. This is especially true if your red blood cells have gotten low (anemic). The transfusion raises the level of the red blood cells which carry oxygen, and this usually causes an energy increase. The  nurse administering the transfusion will monitor you carefully for complications. HOME CARE INSTRUCTIONS  No special instructions are needed after a transfusion. You may find your energy is better. Speak with your caregiver about any limitations on activity for underlying diseases you may have. SEEK MEDICAL CARE IF:  Your condition is not improving after your transfusion. You develop redness or irritation at the intravenous (IV) site. SEEK IMMEDIATE MEDICAL CARE IF:  Any of the following symptoms occur over the next 12 hours: Shaking chills. You have a temperature by mouth above 102 F (38.9 C), not controlled by medicine. Chest, back, or muscle pain. People around you feel you are not acting correctly or are confused. Shortness of breath or difficulty breathing. Dizziness and fainting. You get a rash or develop hives. You have a decrease in urine output. Your urine turns a dark color or changes to pink, red, or brown. Any of the following symptoms occur over the next 10 days: You have a temperature by mouth above 102 F (38.9 C), not controlled by medicine. Shortness of breath. Weakness after normal activity. The white part of the eye turns yellow (jaundice). You have a decrease in the amount of urine or are urinating less often. Your urine turns a dark color or changes to pink, red, or brown. Document Released: 05/11/2000 Document Revised: 08/06/2011 Document Reviewed: 12/29/2007 [employer] Patient Information 2014 [place], [place].  _______________________________________________________________________

## 2024-01-17 ENCOUNTER — Encounter (HOSPITAL_COMMUNITY): Payer: Self-pay

## 2024-01-17 ENCOUNTER — Encounter (HOSPITAL_COMMUNITY)
Admission: RE | Admit: 2024-01-17 | Discharge: 2024-01-17 | Disposition: A | Discharge status: Home / Self Care | Source: Ambulatory Visit | Attending: Psychiatry | Admitting: Psychiatry

## 2024-01-17 ENCOUNTER — Other Ambulatory Visit: Payer: Self-pay

## 2024-01-17 DIAGNOSIS — C541 Malignant neoplasm of endometrium: Secondary | ICD-10-CM | POA: Diagnosis not present

## 2024-01-17 DIAGNOSIS — Z01812 Encounter for preprocedural laboratory examination: Secondary | ICD-10-CM | POA: Diagnosis present

## 2024-01-17 HISTORY — DX: Malignant (primary) neoplasm, unspecified: C80.1

## 2024-01-17 LAB — CBC
HCT: 45.2 % (ref 36.0–46.0)
Hemoglobin: 14.5 g/dL (ref 12.0–15.0)
MCH: 27.8 pg (ref 26.0–34.0)
MCHC: 32.1 g/dL (ref 30.0–36.0)
MCV: 86.8 fL (ref 80.0–100.0)
Platelets: 297 K/uL (ref 150–400)
RBC: 5.21 MIL/uL — ABNORMAL HIGH (ref 3.87–5.11)
RDW: 12.4 % (ref 11.5–15.5)
WBC: 6.7 K/uL (ref 4.0–10.5)
nRBC: 0 % (ref 0.0–0.2)

## 2024-01-17 LAB — COMPREHENSIVE METABOLIC PANEL WITH GFR
ALT: 22 U/L (ref 0–44)
AST: 17 U/L (ref 15–41)
Albumin: 4.4 g/dL (ref 3.5–5.0)
Alkaline Phosphatase: 78 U/L (ref 38–126)
Anion gap: 7 (ref 5–15)
BUN: 13 mg/dL (ref 6–20)
CO2: 23 mmol/L (ref 22–32)
Calcium: 9.4 mg/dL (ref 8.9–10.3)
Chloride: 108 mmol/L (ref 98–111)
Creatinine, Ser: 1 mg/dL (ref 0.44–1.00)
GFR, Estimated: >60 mL/min (ref >60)
Glucose, Bld: 100 mg/dL — ABNORMAL HIGH (ref 70–99)
Potassium: 4.4 mmol/L (ref 3.5–5.1)
Sodium: 138 mmol/L (ref 135–145)
Total Bilirubin: 0.6 mg/dL (ref 0.0–1.2)
Total Protein: 7.4 g/dL (ref 6.5–8.1)

## 2024-01-17 LAB — TYPE AND SCREEN
ABO/RH(D): B NEG
Antibody Screen: NEGATIVE

## 2024-01-17 NOTE — Progress Notes (Signed)
 For Anesthesia: PCP - Lyle Jenkins Setters, NP LOV: 12/30/23 Cardiologist - N/A  Bowel Prep reminder:N/A  Chest x-ray -  EKG -  Stress Test -  ECHO -  Cardiac Cath -  Pacemaker/ICD device last checked: Pacemaker orders received: Device Rep notified:  Spinal Cord Stimulator:N/A  Sleep Study - N/A CPAP -   Fasting Blood Sugar - N/A Checks Blood Sugar _____ times a day Date and result of last Hgb A1c-  Last dose of GLP1 agonist- N/A GLP1 instructions:   Last dose of SGLT-2 inhibitors- N/A SGLT-2 instructions:   Blood Thinner Instructions:N/A Aspirin Instructions: Last Dose:  Activity level: Can go up a flight of stairs and activities of daily living without stopping and without chest pain and/or shortness of breath   Able to exercise without chest pain and/or shortness of breath  Anesthesia review:   Patient denies shortness of breath, fever, cough and chest pain at PAT appointment   Patient verbalized understanding of instructions that were reviewed over the telephone.

## 2024-01-24 ENCOUNTER — Other Ambulatory Visit: Payer: Self-pay | Admitting: Gynecologic Oncology

## 2024-01-24 ENCOUNTER — Telehealth: Payer: Self-pay | Admitting: *Deleted

## 2024-01-24 DIAGNOSIS — C541 Malignant neoplasm of endometrium: Secondary | ICD-10-CM

## 2024-01-24 MED ORDER — TRAMADOL HCL 50 MG PO TABS: 50.0000 mg | ORAL_TABLET | Freq: Four times a day (QID) | ORAL | 0 refills | Status: DC | PRN

## 2024-01-24 MED ORDER — SENNOSIDES-DOCUSATE SODIUM 8.6-50 MG PO TABS: 2.0000 | ORAL_TABLET | Freq: Every day | ORAL | 0 refills | Status: AC

## 2024-01-24 NOTE — Telephone Encounter (Signed)
Telephone call to check on pre-operative status.  Patient compliant with pre-operative instructions.  Reinforced nothing to eat after midnight. Clear liquids until 0645. Patient to arrive at 0745.  No questions or concerns voiced.  Instructed to call for any needs.

## 2024-01-26 NOTE — Anesthesia Preprocedure Evaluation (Signed)
 Anesthesia Evaluation  Patient identified by MRN, date of birth, ID band Patient awake    Reviewed: Allergy & Precautions, NPO status , Patient's Chart, lab work & pertinent test results  History of Anesthesia Complications Negative for: history of anesthetic complications  Airway Mallampati: III  TM Distance: >3 FB Neck ROM: Full    Dental  (+) Dental Advisory Given   Pulmonary neg pulmonary ROS   Pulmonary exam normal breath sounds clear to auscultation       Cardiovascular (-) hypertension(-) angina (-) Past MI, (-) Cardiac Stents and (-) CABG (-) dysrhythmias  Rhythm:Regular Rate:Normal  HLD   Neuro/Psych negative neurological ROS     GI/Hepatic negative GI ROS, Neg liver ROS,,,  Endo/Other  neg diabetes  Class 3 obesity  Renal/GU negative Renal ROS     Musculoskeletal   Abdominal  (+) + obese  Peds  Hematology negative hematology ROS (+) Lab Results      Component                Value               Date                      WBC                      6.7                 01/17/2024                HGB                      14.5                01/17/2024                HCT                      45.2                01/17/2024                MCV                      86.8                01/17/2024                PLT                      297                 01/17/2024              Anesthesia Other Findings   Reproductive/Obstetrics Endometrial cancer                              Anesthesia Physical Anesthesia Plan  ASA: 3  Anesthesia Plan: General   Post-op Pain Management: Tylenol  PO (pre-op)*   Induction: Intravenous  PONV Risk Score and Plan: 3 and Ondansetron , Dexamethasone , Midazolam , Scopolamine  patch - Pre-op and Treatment may vary due to age or medical condition  Airway Management Planned: Oral ETT  Additional Equipment:   Intra-op Plan:   Post-operative Plan:  Extubation in OR  Informed Consent: I have reviewed the  patients History and Physical, chart, labs and discussed the procedure including the risks, benefits and alternatives for the proposed anesthesia with the patient or authorized representative who has indicated his/her understanding and acceptance.     Dental advisory given  Plan Discussed with: CRNA and Anesthesiologist  Anesthesia Plan Comments: (Risks of general anesthesia discussed including, but not limited to, sore throat, hoarse voice, chipped/damaged teeth, injury to vocal cords, nausea and vomiting, allergic reactions, lung infection, heart attack, stroke, and death. All questions answered. )         Anesthesia Quick Evaluation

## 2024-01-28 ENCOUNTER — Encounter (HOSPITAL_COMMUNITY)
Admission: RE | Disposition: A | Discharge status: Home / Self Care | Payer: Self-pay | Source: Home / Self Care | Attending: Psychiatry

## 2024-01-28 ENCOUNTER — Other Ambulatory Visit: Payer: Self-pay

## 2024-01-28 ENCOUNTER — Ambulatory Visit (HOSPITAL_COMMUNITY): Payer: Self-pay | Admitting: Anesthesiology

## 2024-01-28 ENCOUNTER — Encounter (HOSPITAL_COMMUNITY): Payer: Self-pay | Admitting: Psychiatry

## 2024-01-28 ENCOUNTER — Ambulatory Visit (HOSPITAL_COMMUNITY)
Admission: RE | Admit: 2024-01-28 | Discharge: 2024-01-28 | Disposition: A | Discharge status: Home / Self Care | Attending: Psychiatry | Admitting: Psychiatry

## 2024-01-28 DIAGNOSIS — N736 Female pelvic peritoneal adhesions (postinfective): Secondary | ICD-10-CM

## 2024-01-28 DIAGNOSIS — E66813 Obesity, class 3: Secondary | ICD-10-CM | POA: Diagnosis not present

## 2024-01-28 DIAGNOSIS — E785 Hyperlipidemia, unspecified: Secondary | ICD-10-CM | POA: Diagnosis not present

## 2024-01-28 DIAGNOSIS — N3289 Other specified disorders of bladder: Secondary | ICD-10-CM | POA: Diagnosis not present

## 2024-01-28 DIAGNOSIS — C541 Malignant neoplasm of endometrium: Secondary | ICD-10-CM

## 2024-01-28 DIAGNOSIS — Z6841 Body Mass Index (BMI) 40.0 and over, adult: Secondary | ICD-10-CM | POA: Diagnosis not present

## 2024-01-28 HISTORY — PX: INJECTION, FOR SENTINEL LYMPH NODE IDENTIFICATION: SHX7598

## 2024-01-28 HISTORY — PX: LYMPH NODE BIOPSY: SHX201

## 2024-01-28 HISTORY — PX: ROBOTIC ASSISTED TOTAL HYSTERECTOMY WITH BILATERAL SALPINGO OOPHERECTOMY: SHX6086

## 2024-01-28 LAB — ABO/RH: ABO/RH(D): B NEG

## 2024-01-28 LAB — POCT PREGNANCY, URINE: Preg Test, Ur: NEGATIVE

## 2024-01-28 SURGERY — HYSTERECTOMY, TOTAL, ROBOT-ASSISTED, LAPAROSCOPIC, WITH BILATERAL SALPINGO-OOPHORECTOMY
Anesthesia: General | Laterality: Bilateral

## 2024-01-28 MED ORDER — LIDOCAINE HCL (PF) 2 % IJ SOLN
INTRAMUSCULAR | Status: AC
  Filled 2024-01-28: qty 20

## 2024-01-28 MED ORDER — PROPOFOL 10 MG/ML IV BOLUS
INTRAVENOUS | Status: AC
  Filled 2024-01-28: qty 20

## 2024-01-28 MED ORDER — PHENYLEPHRINE 80 MCG/ML (10ML) SYRINGE FOR IV PUSH (FOR BLOOD PRESSURE SUPPORT)
PREFILLED_SYRINGE | INTRAVENOUS | Status: AC
  Filled 2024-01-28: qty 10

## 2024-01-28 MED ORDER — ACETAMINOPHEN 500 MG PO TABS
1000.0000 mg | ORAL_TABLET | ORAL | Status: AC
  Administered 2024-01-28: 1000 mg via ORAL
  Filled 2024-01-28: qty 2

## 2024-01-28 MED ORDER — SODIUM CHLORIDE 0.9 % IV SOLN: INTRAVENOUS | Status: DC | PRN

## 2024-01-28 MED ORDER — STERILE WATER FOR INJECTION IJ SOLN
INTRAMUSCULAR | Status: DC | PRN
  Administered 2024-01-28: 10 mL via INTRAMUSCULAR

## 2024-01-28 MED ORDER — ONDANSETRON HCL 4 MG/2ML IJ SOLN
INTRAMUSCULAR | Status: AC
  Filled 2024-01-28: qty 2

## 2024-01-28 MED ORDER — CEFAZOLIN SODIUM-DEXTROSE 3-4 GM/150ML-% IV SOLN
3.0000 g | INTRAVENOUS | Status: AC
  Administered 2024-01-28: 3 g via INTRAVENOUS
  Filled 2024-01-28: qty 150

## 2024-01-28 MED ORDER — CHLORHEXIDINE GLUCONATE 0.12 % MT SOLN
15.0000 mL | Freq: Once | OROMUCOSAL | Status: AC
  Administered 2024-01-28: 15 mL via OROMUCOSAL

## 2024-01-28 MED ORDER — KETAMINE HCL 50 MG/5ML IJ SOSY
PREFILLED_SYRINGE | INTRAMUSCULAR | Status: AC
  Filled 2024-01-28: qty 5

## 2024-01-28 MED ORDER — PHENYLEPHRINE HCL-NACL 20-0.9 MG/250ML-% IV SOLN
INTRAVENOUS | Status: DC | PRN
  Administered 2024-01-28: 50 ug/min via INTRAVENOUS

## 2024-01-28 MED ORDER — GLYCOPYRROLATE 0.2 MG/ML IJ SOLN
INTRAMUSCULAR | Status: DC | PRN
  Administered 2024-01-28: .2 mg via INTRAVENOUS

## 2024-01-28 MED ORDER — DEXAMETHASONE SODIUM PHOSPHATE 10 MG/ML IJ SOLN
INTRAMUSCULAR | Status: AC
  Filled 2024-01-28: qty 1

## 2024-01-28 MED ORDER — METRONIDAZOLE 500 MG/100ML IV SOLN
500.0000 mg | INTRAVENOUS | Status: AC
  Administered 2024-01-28: 500 mg via INTRAVENOUS
  Filled 2024-01-28: qty 100

## 2024-01-28 MED ORDER — GLYCOPYRROLATE 0.2 MG/ML IJ SOLN
INTRAMUSCULAR | Status: AC
Start: 2024-01-28 — End: 2024-01-28
  Filled 2024-01-28: qty 1

## 2024-01-28 MED ORDER — OXYCODONE HCL 5 MG PO TABS: 5.0000 mg | ORAL_TABLET | Freq: Once | ORAL | Status: DC | PRN

## 2024-01-28 MED ORDER — MIDAZOLAM HCL 2 MG/2ML IJ SOLN
INTRAMUSCULAR | Status: AC
  Filled 2024-01-28: qty 2

## 2024-01-28 MED ORDER — LACTATED RINGERS IR SOLN
Status: DC | PRN
  Administered 2024-01-28: 1000 mL

## 2024-01-28 MED ORDER — SUGAMMADEX SODIUM 200 MG/2ML IV SOLN
INTRAVENOUS | Status: AC
  Filled 2024-01-28: qty 4

## 2024-01-28 MED ORDER — FENTANYL CITRATE (PF) 250 MCG/5ML IJ SOLN
INTRAMUSCULAR | Status: DC | PRN
  Administered 2024-01-28 (×3): 50 ug via INTRAVENOUS
  Administered 2024-01-28: 100 ug via INTRAVENOUS

## 2024-01-28 MED ORDER — GABAPENTIN 300 MG PO CAPS
300.0000 mg | ORAL_CAPSULE | ORAL | Status: AC
  Administered 2024-01-28: 300 mg via ORAL
  Filled 2024-01-28: qty 1

## 2024-01-28 MED ORDER — MIDAZOLAM HCL 2 MG/2ML IJ SOLN
INTRAMUSCULAR | Status: DC | PRN
  Administered 2024-01-28: 2 mg via INTRAVENOUS

## 2024-01-28 MED ORDER — ONDANSETRON HCL 4 MG/2ML IJ SOLN
INTRAMUSCULAR | Status: DC | PRN
Start: 2024-01-28 — End: 2024-01-28
  Administered 2024-01-28: 4 mg via INTRAVENOUS

## 2024-01-28 MED ORDER — HEPARIN SODIUM (PORCINE) 5000 UNIT/ML IJ SOLN
5000.0000 [IU] | INTRAMUSCULAR | Status: AC
  Administered 2024-01-28: 5000 [IU] via SUBCUTANEOUS
  Filled 2024-01-28: qty 1

## 2024-01-28 MED ORDER — LIDOCAINE HCL (PF) 2 % IJ SOLN
INTRAMUSCULAR | Status: AC
  Filled 2024-01-28: qty 5

## 2024-01-28 MED ORDER — PHENYLEPHRINE 80 MCG/ML (10ML) SYRINGE FOR IV PUSH (FOR BLOOD PRESSURE SUPPORT)
PREFILLED_SYRINGE | INTRAVENOUS | Status: DC | PRN
  Administered 2024-01-28: 80 ug via INTRAVENOUS
  Administered 2024-01-28 (×3): 160 ug via INTRAVENOUS
  Administered 2024-01-28: 80 ug via INTRAVENOUS
  Administered 2024-01-28 (×2): 160 ug via INTRAVENOUS

## 2024-01-28 MED ORDER — FENTANYL CITRATE (PF) 250 MCG/5ML IJ SOLN
INTRAMUSCULAR | Status: AC
Start: 2024-01-28 — End: 2024-01-28
  Filled 2024-01-28: qty 5

## 2024-01-28 MED ORDER — SPY AGENT GREEN - (INDOCYANINE FOR INJECTION): INTRAMUSCULAR | Status: DC | PRN

## 2024-01-28 MED ORDER — STERILE WATER FOR INJECTION IJ SOLN: INTRAMUSCULAR | Status: DC | PRN

## 2024-01-28 MED ORDER — AMISULPRIDE (ANTIEMETIC) 5 MG/2ML IV SOLN: 10.0000 mg | Freq: Once | INTRAVENOUS | Status: DC | PRN

## 2024-01-28 MED ORDER — SURGIFLO WITH THROMBIN (HEMOSTATIC MATRIX KIT) OPTIME
TOPICAL | Status: DC | PRN
  Administered 2024-01-28: 1 via TOPICAL

## 2024-01-28 MED ORDER — STERILE WATER FOR INJECTION IJ SOLN
INTRAMUSCULAR | Status: AC
  Filled 2024-01-28: qty 20

## 2024-01-28 MED ORDER — HYDROMORPHONE HCL 1 MG/ML IJ SOLN
INTRAMUSCULAR | Status: AC
  Filled 2024-01-28: qty 1

## 2024-01-28 MED ORDER — BUPIVACAINE HCL 0.25 % IJ SOLN
INTRAMUSCULAR | Status: DC | PRN
  Administered 2024-01-28: 20 mL

## 2024-01-28 MED ORDER — PROPOFOL 10 MG/ML IV BOLUS
INTRAVENOUS | Status: DC | PRN
  Administered 2024-01-28: 200 mg via INTRAVENOUS
  Administered 2024-01-28: 20 mg via INTRAVENOUS

## 2024-01-28 MED ORDER — LACTATED RINGERS IV SOLN: INTRAVENOUS | Status: DC

## 2024-01-28 MED ORDER — ORAL CARE MOUTH RINSE: 15.0000 mL | Freq: Once | OROMUCOSAL | Status: AC

## 2024-01-28 MED ORDER — SCOPOLAMINE 1 MG/3DAYS TD PT72
1.0000 | MEDICATED_PATCH | TRANSDERMAL | Status: DC
  Administered 2024-01-28: 1 mg via TRANSDERMAL
  Filled 2024-01-28: qty 1

## 2024-01-28 MED ORDER — OXYCODONE HCL 5 MG/5ML PO SOLN: 5.0000 mg | Freq: Once | ORAL | Status: DC | PRN

## 2024-01-28 MED ORDER — DEXAMETHASONE SODIUM PHOSPHATE 10 MG/ML IJ SOLN
4.0000 mg | INTRAMUSCULAR | Status: AC
  Administered 2024-01-28: 4 mg via INTRAVENOUS

## 2024-01-28 MED ORDER — LIDOCAINE 2% (20 MG/ML) 5 ML SYRINGE
INTRAMUSCULAR | Status: DC | PRN
Start: 2024-01-28 — End: 2024-01-28
  Administered 2024-01-28: 1.5 mg/kg/h via INTRAVENOUS

## 2024-01-28 MED ORDER — ROCURONIUM BROMIDE 10 MG/ML (PF) SYRINGE
PREFILLED_SYRINGE | INTRAVENOUS | Status: DC | PRN
  Administered 2024-01-28: 20 mg via INTRAVENOUS
  Administered 2024-01-28: 10 mg via INTRAVENOUS
  Administered 2024-01-28: 70 mg via INTRAVENOUS

## 2024-01-28 MED ORDER — SUGAMMADEX SODIUM 200 MG/2ML IV SOLN
INTRAVENOUS | Status: DC | PRN
  Administered 2024-01-28: 400 mg via INTRAVENOUS

## 2024-01-28 MED ORDER — BUPIVACAINE HCL (PF) 0.25 % IJ SOLN
INTRAMUSCULAR | Status: AC
  Filled 2024-01-28: qty 30

## 2024-01-28 MED ORDER — HYDROMORPHONE HCL 1 MG/ML IJ SOLN
0.2500 mg | INTRAMUSCULAR | Status: DC | PRN
  Administered 2024-01-28: 0.5 mg via INTRAVENOUS

## 2024-01-28 MED ORDER — KETAMINE HCL 50 MG/5ML IJ SOSY
PREFILLED_SYRINGE | INTRAMUSCULAR | Status: DC | PRN
Start: 2024-01-28 — End: 2024-01-28
  Administered 2024-01-28: 50 mg via INTRAVENOUS

## 2024-01-28 MED ORDER — STERILE WATER FOR IRRIGATION IR SOLN
Status: DC | PRN
  Administered 2024-01-28: 1000 mL

## 2024-01-28 MED ORDER — LIDOCAINE HCL (PF) 2 % IJ SOLN
INTRAMUSCULAR | Status: DC | PRN
  Administered 2024-01-28: 100 mg via INTRADERMAL

## 2024-01-28 MED ORDER — ROCURONIUM BROMIDE 10 MG/ML (PF) SYRINGE
PREFILLED_SYRINGE | INTRAVENOUS | Status: AC
  Filled 2024-01-28: qty 10

## 2024-01-28 SURGICAL SUPPLY — 70 items
APPLICATOR SURGIFLO ENDO (HEMOSTASIS) IMPLANT
BAG LAPAROSCOPIC 12 15 PORT 16 (BASKET) IMPLANT
BLADE SURG SZ10 CARB STEEL (BLADE) IMPLANT
CATH FOLEY 2WAY SLVR 5CC 16FR (CATHETERS) IMPLANT
CNTNR URN SCR LID CUP LEK RST (MISCELLANEOUS) IMPLANT
COVER BACK TABLE 60X90IN (DRAPES) ×1 IMPLANT
COVER TIP SHEARS 8 DVNC (MISCELLANEOUS) ×1 IMPLANT
DERMABOND ADVANCED .7 DNX12 (GAUZE/BANDAGES/DRESSINGS) ×1 IMPLANT
DRAPE ARM DVNC X/XI (DISPOSABLE) ×4 IMPLANT
DRAPE COLUMN DVNC XI (DISPOSABLE) ×1 IMPLANT
DRAPE SHEET LG 3/4 BI-LAMINATE (DRAPES) ×1 IMPLANT
DRAPE SURG IRRIG POUCH 19X23 (DRAPES) ×1 IMPLANT
DRIVER NDL MEGA SUTCUT DVNCXI (INSTRUMENTS) ×1 IMPLANT
DRIVER NDLE MEGA SUTCUT DVNCXI (INSTRUMENTS) ×1 IMPLANT
DRSG OPSITE POSTOP 4X6 (GAUZE/BANDAGES/DRESSINGS) IMPLANT
DRSG OPSITE POSTOP 4X8 (GAUZE/BANDAGES/DRESSINGS) IMPLANT
ELECT PENCIL ROCKER SW 15FT (MISCELLANEOUS) IMPLANT
ELECT REM PT RETURN 15FT ADLT (MISCELLANEOUS) ×1 IMPLANT
FORCEPS BPLR FENES DVNC XI (FORCEP) ×1 IMPLANT
FORCEPS PROGRASP DVNC XI (FORCEP) ×1 IMPLANT
GAUZE 4X4 16PLY ~~LOC~~+RFID DBL (SPONGE) ×1 IMPLANT
GLOVE BIO SURGEON STRL SZ 6.5 (GLOVE) ×1 IMPLANT
GLOVE BIOGEL PI IND STRL 6.5 (GLOVE) ×2 IMPLANT
GLOVE BIOGEL PI MICRO STRL 6 (GLOVE) ×4 IMPLANT
GOWN STRL REUS W/ TWL LRG LVL3 (GOWN DISPOSABLE) ×4 IMPLANT
GRASPER SUT TROCAR 14GX15 (MISCELLANEOUS) IMPLANT
HOLDER FOLEY CATH W/STRAP (MISCELLANEOUS) IMPLANT
IRRIGATION SUCT STRKRFLW 2 WTP (MISCELLANEOUS) ×1 IMPLANT
KIT PROCEDURE DVNC SI (MISCELLANEOUS) IMPLANT
KIT TURNOVER KIT A (KITS) ×1 IMPLANT
LIGASURE IMPACT 36 18CM CVD LR (INSTRUMENTS) IMPLANT
MANIPULATOR ADVINCU DEL 3.0 PL (MISCELLANEOUS) IMPLANT
MANIPULATOR ADVINCU DEL 3.5 PL (MISCELLANEOUS) IMPLANT
MANIPULATOR UTERINE 4.5 ZUMI (MISCELLANEOUS) IMPLANT
NDL HYPO 21X1.5 SAFETY (NEEDLE) ×1 IMPLANT
NDL INSUFFLATION 14GA 120MM (NEEDLE) IMPLANT
NDL SPNL 20GX3.5 QUINCKE YW (NEEDLE) IMPLANT
NEEDLE HYPO 21X1.5 SAFETY (NEEDLE) ×1 IMPLANT
NEEDLE INSUFFLATION 14GA 120MM (NEEDLE) IMPLANT
NEEDLE SPNL 20GX3.5 QUINCKE YW (NEEDLE) ×1 IMPLANT
OBTURATOR OPTICALSTD 8 DVNC (TROCAR) ×1 IMPLANT
PACK ROBOT GYN CUSTOM WL (TRAY / TRAY PROCEDURE) ×1 IMPLANT
PAD ARMBOARD POSITIONER FOAM (MISCELLANEOUS) ×1 IMPLANT
PAD POSITIONING PINK XL (MISCELLANEOUS) ×1 IMPLANT
PORT ACCESS TROCAR AIRSEAL 12 (TROCAR) IMPLANT
SCISSORS LAP 5X45 EPIX DISP (ENDOMECHANICALS) IMPLANT
SCISSORS MNPLR CVD DVNC XI (INSTRUMENTS) ×1 IMPLANT
SCRUB CHG 4% DYNA-HEX 4OZ (MISCELLANEOUS) ×2 IMPLANT
SEAL UNIV 5-12 XI (MISCELLANEOUS) ×4 IMPLANT
SET TRI-LUMEN FLTR TB AIRSEAL (TUBING) ×1 IMPLANT
SPIKE FLUID TRANSFER (MISCELLANEOUS) ×1 IMPLANT
SPONGE T-LAP 18X18 ~~LOC~~+RFID (SPONGE) IMPLANT
SURGIFLO W/THROMBIN 8M KIT (HEMOSTASIS) IMPLANT
SUT MNCRL AB 4-0 PS2 18 (SUTURE) IMPLANT
SUT PDS AB 1 TP1 54 (SUTURE) IMPLANT
SUT VIC AB 0 CT1 27XBRD ANTBC (SUTURE) IMPLANT
SUT VIC AB 2-0 CT1 TAPERPNT 27 (SUTURE) IMPLANT
SUT VIC AB 3-0 SH 27XBRD (SUTURE) IMPLANT
SUT VIC AB 4-0 PS2 18 (SUTURE) ×2 IMPLANT
SUT VICRYL 0 27 CT2 27 ABS (SUTURE) ×1 IMPLANT
SYR 10ML LL (SYRINGE) IMPLANT
SYSTEM BAG RETRIEVAL 10MM (BASKET) IMPLANT
SYSTEM WOUND ALEXIS 18CM MED (MISCELLANEOUS) IMPLANT
TRAP SPECIMEN MUCUS 40CC (MISCELLANEOUS) IMPLANT
TRAY FOLEY MTR SLVR 16FR STAT (SET/KITS/TRAYS/PACK) ×1 IMPLANT
TROCAR PORT AIRSEAL 5X120 (TROCAR) IMPLANT
TROCAR XCEL NON-BLD 5MMX100MML (ENDOMECHANICALS) ×1 IMPLANT
UNDERPAD 30X36 HEAVY ABSORB (UNDERPADS AND DIAPERS) ×2 IMPLANT
WATER STERILE IRR 1000ML POUR (IV SOLUTION) ×1 IMPLANT
YANKAUER SUCT BULB TIP 10FT TU (MISCELLANEOUS) IMPLANT

## 2024-01-28 NOTE — Anesthesia Procedure Notes (Signed)
 Procedure Name: Intubation Date/Time: 01/28/2024 10:53 AM  Performed by: Augusta Daved SAILOR, CRNAPre-anesthesia Checklist: Patient identified, Emergency Drugs available, Suction available and Patient being monitored Patient Re-evaluated:Patient Re-evaluated prior to induction Oxygen Delivery Method: Circle System Utilized Preoxygenation: Pre-oxygenation with 100% oxygen Induction Type: IV induction Ventilation: Mask ventilation without difficulty and Oral airway inserted - appropriate to patient size Laryngoscope Size: Cleotilde and 2 Grade View: Grade II Tube type: Oral Tube size: 7.0 mm Number of attempts: 1 Airway Equipment and Method: Stylet and Oral airway Placement Confirmation: ETT inserted through vocal cords under direct vision, positive ETCO2 and breath sounds checked- equal and bilateral Secured at: 23 (at the lip) cm Tube secured with: Tape Dental Injury: Teeth and Oropharynx as per pre-operative assessment

## 2024-01-28 NOTE — Interval H&P Note (Signed)
 History and Physical Interval Note:  01/28/2024 10:33 AM  Martha Armstrong  has presented today for surgery, with the diagnosis of Endometrial cancer.  The various methods of treatment have been discussed with the patient and family. After consideration of risks, benefits and other options for treatment, the patient has consented to  Procedure(s): HYSTERECTOMY, TOTAL, ROBOT-ASSISTED, LAPAROSCOPIC, WITH BILATERAL SALPINGO-OOPHORECTOMY (Bilateral) INJECTION, FOR SENTINEL LYMPH NODE IDENTIFICATION (N/A) LYMPH NODE BIOPSY (N/A) as a surgical intervention.  The patient's history has been reviewed, patient examined, no change in status, stable for surgery.  I have reviewed the patient's chart and labs.  Questions were answered to the patient's satisfaction.     Dayquan Buys

## 2024-01-28 NOTE — Transfer of Care (Signed)
 Immediate Anesthesia Transfer of Care Note  Patient: Martha Armstrong  Procedure(s) Performed: ROBOTIC ASSISTED TOTAL LAPAROSCOPIC HYSTERECTOMY WITH BILATERAL SALPINGO-OOPHORECTOMY, CYSTOSCOPY (Bilateral) BILATERAL SENTINEL LYMPH NODE INJECTION (Bilateral) BILATERAL SENTINEL LYMPH NODE BIOPSY (Bilateral)  Patient Location: PACU  Anesthesia Type:General  Level of Consciousness: drowsy  Airway & Oxygen Therapy: Patient Spontanous Breathing and Patient connected to face mask oxygen  Post-op Assessment: Report given to RN and Post -op Vital signs reviewed and stable  Post vital signs: Reviewed and stable  Last Vitals:  Vitals Value Taken Time  BP 99/52 01/28/24 14:02  Temp    Pulse 67 01/28/24 14:04  Resp 19 01/28/24 14:04  SpO2 98 % 01/28/24 14:04  Vitals shown include unfiled device data.  Last Pain:  Vitals:   01/28/24 0822  TempSrc:   PainSc: 0-No pain      Patients Stated Pain Goal: 5 (01/28/24 0815)  Complications: No notable events documented.

## 2024-01-28 NOTE — Anesthesia Postprocedure Evaluation (Signed)
 Anesthesia Post Note  Patient: Martha Armstrong  Procedure(s) Performed: ROBOTIC ASSISTED TOTAL LAPAROSCOPIC HYSTERECTOMY WITH BILATERAL SALPINGO-OOPHORECTOMY, CYSTOSCOPY (Bilateral) BILATERAL SENTINEL LYMPH NODE INJECTION (Bilateral) BILATERAL SENTINEL LYMPH NODE BIOPSY (Bilateral)     Patient location during evaluation: PACU Anesthesia Type: General Level of consciousness: awake and alert Pain management: pain level controlled Vital Signs Assessment: post-procedure vital signs reviewed and stable Respiratory status: spontaneous breathing, nonlabored ventilation and respiratory function stable Cardiovascular status: blood pressure returned to baseline and stable Postop Assessment: no apparent nausea or vomiting Anesthetic complications: no   No notable events documented.  Last Vitals:  Vitals:   01/28/24 1506 01/28/24 1515  BP:  95/63  Pulse: 64 73  Resp: 19 15  Temp:    SpO2: 94% 95%    Last Pain:  Vitals:   01/28/24 1515  TempSrc:   PainSc: 0-No pain                 Butler Levander Pinal

## 2024-01-28 NOTE — Op Note (Signed)
 GYNECOLOGIC ONCOLOGY OPERATIVE NOTE  Date of Service: 01/28/2024  Preoperative Diagnosis: FIGO grade 1 endometrial cancer  Postoperative Diagnosis: Same, bladder adhesions  Procedures: Robotic-assisted total laparoscopic hysterectomy, bilateral salpingo-oophorectomy, bilateral sentinel lymph node evaluation and biopsy, cystoscopy (Modifer 22: increased duration of the procedure by >20min due to complexity due adhesive disease requiring extensive meticulous lysis of adhesions, necessitating additional instrumentation for retraction and safe exposure)   Surgeon: Hoy Masters, MD  Assistants: Eleanor Epps, NP  Anesthesia: General  Estimated Blood Loss: 100 mL    Fluids: 1350 ml, crystalloid  Urine Output: 100 ml, clear yellow  Findings: Normal upper abdominal survey with normal liver surface and diaphragm. Normal appearing small and large bowel. Filmy adhesions of the omentum to the anterior abdominal wall inferior to the umbilicus. Normal uterus, tubes, and ovaries. No evidence of peritoneal disease, ascites, or carcinomatosis. Sentinel mapping on right to the external iliac; sentinel mapping on left to the external iliac. Dense adhesions of the bladder to the lower uterine segment. Bladder backfilled with no evidence of leakage. Cystoscopy with intact bladder. Strong efflux from bilateral ureters.    Specimens:  ID Type Source Tests Collected by Time Destination  1 : Right external illiac sentinal lymph node Tissue PATH Sentinel Lymph Node SURGICAL PATHOLOGY Masters Hoy, MD 01/28/2024 1152   2 : Left external illiac sentinal lymph node Tissue PATH Sentinel Lymph Node SURGICAL PATHOLOGY Masters Hoy, MD 01/28/2024 1158   3 : Uterus, cervix, bilateral tubes and ovaries Tissue PATH Gyn tumor resection SURGICAL PATHOLOGY Masters Hoy, MD 01/28/2024 1251     Complications:  None  Indications for Procedure: Martha Armstrong is a 48 y.o. woman with FIGO grade 1 endometrioid  endometrial cancer.  Prior to the procedure, all risks, benefits, and alternatives were discussed and informed surgical consent was signed.  Procedure: Patient was taken to the operating room where general anesthesia was achieved.  She was positioned in dorsal lithotomy and prepped and draped.  A foley catheter was inserted into the bladder. 1 ml of dilute Indo-Cyanine dye was was injected at 1cm and 1mm deep at 3 and 9 o'clock in the cervical stroma.  The cervix was dilated and an Advincula uterine manipulator with a colpotomy ring was inserted into the uterus.  A 12 mm incision was made in the left upper quadrant near Palmer's point.  The abdomen was entered with a 5 mm OptiView trocar under direct visualization.  The abdomen was insufflated, the patient placed in steep Trendelenburg, and additional trocars were placed as follows: an 8mm trocar superior to the umbilicus, two 8 mm robotic trocars in the right abdomen, and one 8 mm robotic trocar in the left abdomen.  The left upper quadrant trocar was removed and replaced with a 12 mm airseal trocar.  All trocars were placed under direct visualization. The adhesions of the omentum to the anterior abdominal wall were lysed sharply with cold scissors.  The bowels were moved into the upper abdomen.  The DaVinci robotic surgical system was brought to the patient's bedside and docked.  The right round ligament was transected and the retroperitoneum entered.The right ureter was identified. The paravesical and pararectal spaces were opened, and the node was found to be located on the external iliac vessels. A sentinel lymph node dissection was performed taking care to avoid injury to the ureter, superior vesicle artery or obturator nerve. A similar procedure was performed on left with the sentinel lymph node found on the external iliac vessels. The sentinel  lymph nodes mentioned above were identified and removed through the assistant trocar.  The right ureter was  again identified, and the right infundibulopelvic ligament was isolated, cauterized, and transected. The posterior peritoneum was opened to the colpotomy ring. The anterior peritoneum was opened and the bladder flap was initiated but dense adhesions of the bladder to the anterior lower uterine segment were encountered. A similar procedure was performed on the left side. Attention was returned to the right. Blunt dissection was used to identify the plane between the bladder and the cervix from the right lateral approach. With this plane identified, working cephalad, the bladder was dissection off of the cervix until the only remaining attachments were of the bladder serosa to the uterus. This was repeated on the left. Bleeding from the left uterine vessels was encountered which was controlled with electrocautery. The bladder was backfilled and then the serosa was sharply and bluntly dissected off of the uterus. Bladder was noted to be intact.   The right uterine artery was skeletonized, cauterized, and transected at the level of the colpotomy ring. Additional cautery was used in a C-shaped fashion to allow the remainder of the broad, cardinal, and uterosacral ligaments with the uterine vessels to be transected and fall away from the colpotomy ring.  A similar procedure was performed on the contralateral side. A colpotomy was made circumferentially following the contours of the colpotomy ring.  The uterine specimen was removed through the vagina.    The vaginal cuff was closed with a running stitch of 0 Vicryl suture. An additional figure of eight with 3-0 vicryl was placed on the left bladder adventitial tissue to obtain hemostasis. The pelvis was irrigated. Ureterolysis was performed on the left and the ureter was noted to be far from the area of dissection and cauterization of the left uterine vessels. Flo seal was placed at the cuff and all operative sites were found to be hemostatic.    The bladder was  drained and again backfilled. The laparoscope was introduced, and the bladder was inspected with the findings as noted above. The camera was removed. The bladder was drained and the foley catheter removed.  All instruments were removed and the robot was taken from the patient's bedside. The fascia at the 12 mm incision was closed with 0 Vicryl using a PMI device. The abdomen was desufflated and all ports were removed. The skin at all incisions was closed with 4-0 Vicryl to reapproximate the subcutaneous tissue and 4-0 monocryl in a subcuticular fashion followed by surgical glue.  Patient tolerated the procedure well. Sponge, lap, and instrument counts were correct.  Patient received 2 gm of Ancef  and 500mg  of metronidazole  prior to skin incision for routine perioperative antibiotic prophylaxis.  She was extubated and taken to the PACU in stable condition.  Hoy Masters, MD Gynecologic Oncology

## 2024-01-28 NOTE — Discharge Instructions (Addendum)
 AFTER SURGERY INSTRUCTIONS   Return to work: 4-6 weeks if applicable   Activity: 1. Be up and out of the bed during the day.  Take a nap if needed.  You may walk up steps but be careful and use the hand rail.  Stair climbing will tire you more than you think, you may need to stop part way and rest.    2. No lifting or straining for 6 weeks over 10 pounds. No pushing, pulling, straining for 6 weeks.   3. No driving for 2-95 days when the following criteria have been met: Do not drive if you are taking narcotic pain medicine and make sure that your reaction time has returned.    4. You can shower as soon as the next day after surgery. Shower daily.  Use your regular soap and water (not directly on the incision) and pat your incision(s) dry afterwards; don't rub.  No tub baths or submerging your body in water until cleared by your surgeon. If you have the soap that was given to you by pre-surgical testing that was used before surgery, you do not need to use it afterwards because this can irritate your incisions.    5. No sexual activity and nothing in the vagina for 12 weeks.   6. You may experience a small amount of clear drainage from your incisions, which is normal.  If the drainage persists, increases, or changes color please call the office.   7. Do not use creams, lotions, or ointments such as neosporin on your incisions after surgery until advised by your surgeon because they can cause removal of the dermabond glue on your incisions.     8. You may experience vaginal spotting after surgery or when the stitches at the top of the vagina begin to dissolve.  The spotting is normal but if you experience heavy bleeding, call our office.   9. Take Tylenol or ibuprofen first for pain if you are able to take these medications and only use narcotic pain medication for severe pain not relieved by the Tylenol or Ibuprofen.  Monitor your Tylenol intake to a max of 4,000 mg in a 24 hour period. You can  alternate these medications after surgery.   Diet: 1. Low sodium Heart Healthy Diet is recommended but you are cleared to resume your normal (before surgery) diet after your procedure.   2. It is safe to use a laxative, such as Miralax or Colace, if you have difficulty moving your bowels before surgery. You have been prescribed Sennakot-S to take at bedtime every evening after surgery to keep bowel movements regular and to prevent constipation.     Wound Care: 1. Keep clean and dry.  Shower daily.   Reasons to call the Doctor: Fever - Oral temperature greater than 100.4 degrees Fahrenheit Foul-smelling vaginal discharge Difficulty urinating Nausea and vomiting Increased pain at the site of the incision that is unrelieved with pain medicine. Difficulty breathing with or without chest pain New calf pain especially if only on one side Sudden, continuing increased vaginal bleeding with or without clots.   Contacts: For questions or concerns you should contact:   Dr. Clide Cliff at [phone]   Warner Mccreedy, NP at [phone]   After Hours: call [phone] and have the GYN Oncologist paged/contacted (after 5 pm or on the weekends). You will speak with an after hours RN and let he or she know you have had surgery.   Messages sent via mychart are for non-urgent  matters and are not responded to after hours so for urgent needs, please call the after hours number.

## 2024-01-29 ENCOUNTER — Encounter (HOSPITAL_COMMUNITY): Payer: Self-pay | Admitting: Psychiatry

## 2024-01-29 ENCOUNTER — Telehealth: Payer: Self-pay | Admitting: *Deleted

## 2024-01-29 NOTE — Telephone Encounter (Signed)
 Spoke with Ms. Wike this morning. She states she is eating, drinking and urinating well. She has not had a BM yet but is passing gas. She is taking senokot as prescribed and encouraged her to drink plenty of water . She denies fever or chills. Incisions are dry and intact. She rates her pain 6/10. Her pain is controlled with tramadol .    Instructed to call office with any fever, chills, purulent drainage, uncontrolled pain or any other questions or concerns. Patient verbalizes understanding.   Pt aware of post op appointments as well as the office number [phone] and after hours number [phone] to call if she has any questions or concerns

## 2024-01-29 NOTE — Telephone Encounter (Signed)
 2nd attempt to reach patient for post op call. Left voicemail requesting call back.

## 2024-01-29 NOTE — Telephone Encounter (Signed)
 Attempted to reach patient for post op call. Left voicemail requesting call back.

## 2024-01-30 ENCOUNTER — Encounter: Payer: Self-pay | Admitting: Psychiatry

## 2024-02-02 LAB — SURGICAL PATHOLOGY

## 2024-02-03 ENCOUNTER — Ambulatory Visit: Payer: Self-pay | Admitting: Psychiatry

## 2024-02-03 ENCOUNTER — Other Ambulatory Visit: Payer: Self-pay | Admitting: Oncology

## 2024-02-03 NOTE — Progress Notes (Signed)
 Gynecologic Oncology Multi-Disciplinary Disposition Conference Note  Date of the Conference: 02/03/2024  Patient Name: Martha Armstrong  Referring Provider: Dr. Waylan Primary GYN Oncologist: Dr. Eldonna   Stage/Disposition:  FIGO grade 1 endometrioid endometrial cancer. Disposition is to close observation.   This Multidisciplinary conference took place involving physicians from Gynecologic Oncology, Medical Oncology, Radiation Oncology, Pathology, Radiology along with the Gynecologic Oncology Nurse Practitioner and Gynecologic Oncology Nurse Navigator.  Comprehensive assessment of the patient's malignancy, staging, need for surgery, chemotherapy, radiation therapy, and need for further testing were reviewed. Supportive measures, both inpatient and following discharge were also discussed. The recommended plan of care is documented. Greater than 35 minutes were spent correlating and coordinating this patient's care.

## 2024-02-17 ENCOUNTER — Inpatient Hospital Stay: Attending: Psychiatry | Admitting: Psychiatry

## 2024-02-17 ENCOUNTER — Encounter: Payer: Self-pay | Admitting: Psychiatry

## 2024-02-17 VITALS — BP 123/68 | HR 78 | Temp 98.1°F | Resp 20 | Wt 260.0 lb

## 2024-02-17 DIAGNOSIS — Z90722 Acquired absence of ovaries, bilateral: Secondary | ICD-10-CM | POA: Diagnosis not present

## 2024-02-17 DIAGNOSIS — C541 Malignant neoplasm of endometrium: Secondary | ICD-10-CM | POA: Insufficient documentation

## 2024-02-17 DIAGNOSIS — Z90710 Acquired absence of both cervix and uterus: Secondary | ICD-10-CM | POA: Diagnosis not present

## 2024-02-17 DIAGNOSIS — Z9079 Acquired absence of other genital organ(s): Secondary | ICD-10-CM | POA: Diagnosis not present

## 2024-02-17 DIAGNOSIS — Z7189 Other specified counseling: Secondary | ICD-10-CM

## 2024-02-17 NOTE — Patient Instructions (Signed)
 It was a pleasure to see you in clinic today. - Healing well - No lifting until 6 weeks after surgery. Nothing in the vagina until 10wks. - Return visit planned for 6 months.   Thank you very much for allowing me to provide care for you today.  I appreciate your confidence in choosing our Gynecologic Oncology team at [facility].  If you have any questions about your visit today please call our office or send us  a MyChart message and we will get back to you as soon as possible.

## 2024-02-17 NOTE — Progress Notes (Signed)
 Gynecologic Oncology Return Clinic Visit  Date of Service: 02/17/2024 Referring Provider: Nena App, MD   Assessment & Plan: Martha Armstrong is a 48 y.o. 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  Postop: - Pt recovering well from surgery and healing appropriately postoperatively - Ongoing postoperative expectations and precautions reviewed. Continue with no lifting >10lbs through 6 weeks postoperatively  Endometrial cancer: - Pathology results reviewed in detail - No high-intermediate risk factors. - Recommend observation. - Signs/symptoms of recurrence reviewed. - Surveillance reviewed. Follow-up q6 months x2 years then yearly.    RTC 67mo.  Hoy Masters, MD Gynecologic Oncology   Medical Decision Making I personally spent  TOTAL 15 minutes face-to-face and non-face-to-face in the care of this patient, which includes all pre, intra, and post visit time on the date of service. The discussion of treatment of endometrial cancer is beyond the scope of routine postoperative care.   ----------------------- Reason for Visit: Postop/Treatment counseling  Treatment History: Oncology History  Endometrial cancer (HCC)  12/17/2023 Initial Biopsy   Endometrium, biopsy: Endometrial adenocarcinoma, endometrioid type, FIGO grade 1 of 3 ER: Positive (greater than 90%) PR: Positive (greater than 90%) P53 expression: Normal expression (wild-type) MMR proteins: Intact   12/17/2023 Initial Diagnosis   Endometrial cancer (HCC)   01/28/2024 Surgery   Procedures: Robotic-assisted total laparoscopic hysterectomy, bilateral salpingo-oophorectomy, bilateral sentinel lymph node evaluation and biopsy, cystoscopy   Findings: Normal upper abdominal survey with normal liver surface and diaphragm. Normal appearing small and large bowel. Filmy adhesions of the omentum to the anterior  abdominal wall inferior to the umbilicus. Normal uterus, tubes, and ovaries. No evidence of peritoneal disease, ascites, or carcinomatosis. Sentinel mapping on right to the external iliac; sentinel mapping on left to the external iliac. Dense adhesions of the bladder to the lower uterine segment. Bladder backfilled with no evidence of leakage. Cystoscopy with intact bladder. Strong efflux from bilateral ureters.    01/28/2024 Cancer Staging   Staging form: Corpus Uteri - Carcinoma and Carcinosarcoma, AJCC 8th Edition and FIGO 2023 - Pathologic stage from 01/28/2024: FIGO Stage IA1, calculated as Stage IA (pT1a, pN0(sn), cM0, POLE: Unknown, MMRd-, p53-) - Signed by Masters Hoy, MD on 02/17/2024 Histopathologic type: Endometrioid adenocarcinoma, NOS Stage prefix: Initial diagnosis Method of lymph node assessment: Sentinel lymph node biopsy Histologic grade (G): G1 Histologic grading system: 3 grade system   01/28/2024 Pathology Results   A. SENTINAL LYMPH NODE, RIGHT EXTERNAL ILIAC, BIOPSY: - Lymph node, negative for carcinoma (0/1)  B. SENTINAL LYMPH NODE, LEFT EXTERNAL ILIAC, BIOPSY: - Lymph node, negative for carcinoma (0/1)  C. UTERUS, CERVIX, FALLOPIAN TUBE, OVARY, BILATERAL, HYSTERECTOMY: - Endometrioid carcinoma, FIGO grade 1 - Carcinoma is confined to endometrium - no evidence of myometrial invasion - Lymphovascular invasion is not identified - Benign unremarkable cervix - Benign unremarkable bilateral ovaries and fallopian tubes - See oncology table  ONCOLOGY TABLE:  UTERUS, CARCINOMA OR CARCINOSARCOMA: Resection  Procedure: Total hysterectomy and bilateral salpingo-oophorectomy Histologic Type: Endometrioid carcinoma Histologic Grade: FIGO grade 1 Myometrial Invasion: No evidence of myometrial invasion Uterine Serosa Involvement: Not identified Cervical stromal Involvement: Not identified Extent of involvement of other tissue/organs: Not identified Peritoneal/Ascitic  Fluid: Not applicable Lymphovascular Invasion: Not identified Regional Lymph Nodes:      Pelvic Lymph Nodes Examined:  2 Sentinel                                  0 Non-sentinel                                  2 Total      Pelvic Lymph Nodes with Metastasis: 0                          Macrometastasis: (>2.0 mm): 0                          Micrometastasis: (>0.2 mm and < 2.0 mm): 0                          Isolated Tumor Cells (<0.2 mm): 0                          Laterality of Lymph Node with Tumor: Not applicable                          Extracapsular Extension: Not applicable      Para-aortic Lymph Nodes Examined:                                   0 Sentinel                                   0 non-sentinel                                   0 total Distant Metastasis:      Distant Site(s) Involved: Not applicable Pathologic Stage Classification (pTNM, AJCC 8th Edition): pT1a, pN0 Ancillary Studies: MMR testing will be ordered Representative Tumor Block: C6 Comment(s): Pancytokeratin was performed on the lymph nodes and is negative.   IHC EXPRESSION RESULTS   TEST           RESULT  MLH1:          Preserved nuclear expression  MSH2:          Preserved nuclear expression  MSH6:          Preserved nuclear expression  PMS2:          Preserved nuclear expression      Interval History: Pt reports that she is recovering well from surgery. She is not needing anything for pain. She is eating and drinking well. She is voiding without issue and having regular bowel movements except for when she stopped senna past two days and hasn't had a BM. No bleeding.   Past Medical/Surgical History: Past Medical History:  Diagnosis Date   Cancer (HCC)    Hyperlipidemia     Past Surgical History:  Procedure Laterality Date   CESAREAN SECTION  10/04/2003   CESAREAN SECTION  10/16/2005   INJECTION, FOR SENTINEL LYMPH NODE IDENTIFICATION Bilateral 01/28/2024    Procedure: BILATERAL SENTINEL LYMPH NODE INJECTION;  Surgeon: Eldonna Mays, MD;  Location: [facility];  Service:  Gynecology;  Laterality: Bilateral;   LYMPH NODE BIOPSY Bilateral 01/28/2024   Procedure: BILATERAL SENTINEL LYMPH NODE BIOPSY;  Surgeon: Eldonna Mays, MD;  Location: [facility];  Service: Gynecology;  Laterality: Bilateral;   ROBOTIC ASSISTED TOTAL HYSTERECTOMY WITH BILATERAL SALPINGO OOPHERECTOMY Bilateral 01/28/2024   Procedure: ROBOTIC ASSISTED TOTAL LAPAROSCOPIC HYSTERECTOMY WITH BILATERAL SALPINGO-OOPHORECTOMY, CYSTOSCOPY;  Surgeon: Eldonna Mays, MD;  Location: [facility];  Service: Gynecology;  Laterality: Bilateral;    Family History  Problem Relation Age of Onset   Breast cancer Neg Hx    Ovarian cancer Neg Hx    Colon cancer Neg Hx    Endometrial cancer Neg Hx     Social History   Socioeconomic History   Marital status: Married    Spouse name: Not on file   Number of children: Not on file   Years of education: Not on file   Highest education level: Not on file  Occupational History   Not on file  Tobacco Use   Smoking status: Never    Passive exposure: Never   Smokeless tobacco: Never  Vaping Use   Vaping status: Never Used  Substance and Sexual Activity   Alcohol use: Not Currently   Drug use: Never   Sexual activity: Yes    Partners: Male    Birth control/protection: Surgical  Other Topics Concern   Not on file  Social History Narrative   Not on file   Social Drivers of Health   Financial Resource Strain: Low Risk  (10/06/2023)   Received from [facility]   Overall Financial Resource Strain (CARDIA)    Difficulty of Paying Living Expenses: Not hard at all  Food Insecurity: No Food Insecurity (10/06/2023)   Received from [facility]   Hunger Vital Sign    Within the past 12 months, you worried that your food would run out before you got the money to buy more.: Never true    Within the past 12 months, the food you bought just didn't last and you  didn't have money to get more.: Never true  Transportation Needs: No Transportation Needs (10/06/2023)   Received from [facility] - Transportation    Lack of Transportation (Medical): No    Lack of Transportation (Non-Medical): No  Physical Activity: Unknown (10/06/2023)   Received from [facility]   Exercise Vital Sign    On average, how many days per week do you engage in moderate to strenuous exercise (like a brisk walk)?: 0 days    Minutes of Exercise per Session: Not on file  Stress: No Stress Concern Present (10/06/2023)   Received from [facility] - Occupational Stress Questionnaire    Feeling of Stress : Not at all  Social Connections: Socially Integrated (10/06/2023)   Received from [facility]   Social Network    How would you rate your social network (family, work, friends)?: Good participation with social networks    Current Medications:  Current Outpatient Medications:    senna-docusate (SENOKOT-S) 8.6-50 MG tablet, Take 2 tablets by mouth at bedtime. For AFTER surgery, do not take if having diarrhea, Disp: 30 tablet, Rfl: 0   Multiple Vitamin (MULTIVITAMIN WITH MINERALS) TABS tablet, Take 1 tablet by mouth daily., Disp: , Rfl:    rosuvastatin (CRESTOR) 10 MG tablet, Take 10 mg by mouth., Disp: , Rfl:   Review of Symptoms: Complete 10-system review is negative except as above in Interval History.  Physical Exam: BP 123/68 (  BP Location: Left Arm, Patient Position: Sitting)   Pulse 78   Temp 98.1 F (36.7 C) (Oral)   Resp 20   Wt 260 lb (117.9 kg)   SpO2 100%   BMI 40.72 kg/m  General: Alert, oriented, no acute distress. HEENT: Normocephalic, atraumatic. Neck symmetric without masses. Sclera anicteric.  Chest: Normal work of breathing. Clear to auscultation bilaterally.   Cardiovascular: Regular rate and rhythm, no murmurs. Abdomen: Soft, nontender.  Normoactive bowel sounds.  No masses appreciated.   Well-healing incisions with glue. Extremities: Grossly normal range of motion.  Warm, well perfused.  No edema bilaterally. Skin: No rashes or lesions noted. GU: Normal appearing external genitalia without erythema, excoriation, or lesions.  Speculum exam reveals intact vaginal cuff healing well.  Bimanual exam reveals intact cuff. Exam chaperoned by Kimberly [place], CMA   Laboratory & Radiologic Studies: Surgical pathology (01/28/24): A. SENTINAL LYMPH NODE, RIGHT EXTERNAL ILIAC, BIOPSY: - Lymph node, negative for carcinoma (0/1)  B. SENTINAL LYMPH NODE, LEFT EXTERNAL ILIAC, BIOPSY: - Lymph node, negative for carcinoma (0/1)  C. UTERUS, CERVIX, FALLOPIAN TUBE, OVARY, BILATERAL, HYSTERECTOMY: - Endometrioid carcinoma, FIGO grade 1 - Carcinoma is confined to endometrium - no evidence of myometrial invasion - Lymphovascular invasion is not identified - Benign unremarkable cervix - Benign unremarkable bilateral ovaries and fallopian tubes - See oncology table  ONCOLOGY TABLE:  UTERUS, CARCINOMA OR CARCINOSARCOMA: Resection  Procedure: Total hysterectomy and bilateral salpingo-oophorectomy Histologic Type: Endometrioid carcinoma Histologic Grade: FIGO grade 1 Myometrial Invasion: No evidence of myometrial invasion Uterine Serosa Involvement: Not identified Cervical stromal Involvement: Not identified Extent of involvement of other tissue/organs: Not identified Peritoneal/Ascitic Fluid: Not applicable Lymphovascular Invasion: Not identified Regional Lymph Nodes:      Pelvic Lymph Nodes Examined:                                  2 Sentinel                                  0 Non-sentinel                                  2 Total      Pelvic Lymph Nodes with Metastasis: 0                          Macrometastasis: (>2.0 mm): 0                          Micrometastasis: (>0.2 mm and < 2.0 mm): 0                          Isolated Tumor Cells (<0.2 mm): 0                           Laterality of Lymph Node with Tumor: Not applicable                          Extracapsular Extension: Not applicable      Para-aortic Lymph Nodes Examined:  0 Sentinel                                   0 non-sentinel                                   0 total Distant Metastasis:      Distant Site(s) Involved: Not applicable Pathologic Stage Classification (pTNM, AJCC 8th Edition): pT1a, pN0 Ancillary Studies: MMR testing will be ordered Representative Tumor Block: C6 Comment(s): Pancytokeratin was performed on the lymph nodes and is negative.   IHC EXPRESSION RESULTS   TEST           RESULT  MLH1:          Preserved nuclear expression  MSH2:          Preserved nuclear expression  MSH6:          Preserved nuclear expression  PMS2:          Preserved nuclear expression

## 2024-03-13 NOTE — Progress Notes (Signed)
 Patient here for a pre-operative appointment prior to her scheduled surgery on 01/28/24. She is scheduled for a robotic staging surgery including total hysterectomy, bilateral salpingo-oophorectomy, sentinel lymph node biopsy, possible lymph node dissection, possible laparotomy.  The surgery was discussed in detail.  See after visit summary for additional details. Visual aids used to discuss items related to surgery including the incentive spirometer, sequential compression stockings, foley catheter, IV pump, multi-modal pain regimen including tylenol , photo of the surgical robot, female reproductive system to discuss surgery in detail.      Discussed post-op pain management in detail including the aspects of the enhanced recovery pathway.  Advised her that a new prescription would be sent in for tramadol  and it is only to be used for after her upcoming surgery.  We discussed the use of tylenol  post-op and to monitor for a maximum of 4,000 mg in a 24 hour period.  Also prescribed sennakot to be used after surgery and to hold if having loose stools.  Discussed bowel regimen in detail.     Discussed the use of SCDs and measures to take at home to prevent DVT including frequent mobility.  Reportable signs and symptoms of DVT discussed. Post-operative instructions discussed and expectations for after surgery. Incisional care discussed as well including reportable signs and symptoms including erythema, drainage, wound separation.     15 minutes spent with the patient.  Verbalizing understanding of material discussed. No needs or concerns voiced at the end of the visit.   Advised patient to call for any needs.  Advised that her post-operative medications had been prescribed and could be picked up at any time.    This appointment is included in the global surgical bundle as pre-operative teaching and has no charge.

## 2024-04-03 ENCOUNTER — Encounter: Payer: Self-pay | Admitting: *Deleted

## 2024-04-03 ENCOUNTER — Telehealth: Payer: Self-pay | Admitting: *Deleted

## 2024-04-03 ENCOUNTER — Encounter: Payer: Self-pay | Admitting: Psychiatry

## 2024-04-03 NOTE — Telephone Encounter (Signed)
 Attempted to reach patient about her MyChart message. Left voicemail requesting call back to [phone].

## 2024-06-01 ENCOUNTER — Other Ambulatory Visit: Payer: Self-pay | Admitting: Nurse Practitioner

## 2024-06-01 DIAGNOSIS — Z1231 Encounter for screening mammogram for malignant neoplasm of breast: Secondary | ICD-10-CM

## 2024-06-16 ENCOUNTER — Ambulatory Visit
Admission: RE | Admit: 2024-06-16 | Discharge: 2024-06-16 | Disposition: A | Source: Ambulatory Visit | Attending: Nurse Practitioner

## 2024-06-16 DIAGNOSIS — Z1231 Encounter for screening mammogram for malignant neoplasm of breast: Secondary | ICD-10-CM

## 2024-08-17 ENCOUNTER — Ambulatory Visit: Admitting: Psychiatry
